# Patient Record
Sex: Female | Born: 1955
Health system: Southern US, Community
[De-identification: ages and names within clinical notes are randomized; demographics above are authoritative.]

## PROBLEM LIST (undated history)

## (undated) DIAGNOSIS — E785 Hyperlipidemia, unspecified: Secondary | ICD-10-CM

## (undated) DIAGNOSIS — I10 Essential (primary) hypertension: Secondary | ICD-10-CM

---

## 2000-05-10 ENCOUNTER — Other Ambulatory Visit: Admission: RE | Admit: 2000-05-10 | Discharge: 2000-05-10 | Payer: Self-pay | Admitting: Family Medicine

## 2002-03-05 ENCOUNTER — Other Ambulatory Visit: Admission: RE | Admit: 2002-03-05 | Discharge: 2002-03-05 | Payer: Self-pay | Admitting: Family Medicine

## 2004-06-17 ENCOUNTER — Other Ambulatory Visit: Admission: RE | Admit: 2004-06-17 | Discharge: 2004-06-17 | Payer: Self-pay | Admitting: Family Medicine

## 2007-05-01 ENCOUNTER — Encounter: Admission: RE | Admit: 2007-05-01 | Discharge: 2007-05-01 | Payer: Self-pay | Admitting: Family Medicine

## 2012-02-12 ENCOUNTER — Emergency Department (HOSPITAL_COMMUNITY): Payer: Self-pay

## 2012-02-12 ENCOUNTER — Encounter (HOSPITAL_COMMUNITY): Payer: Self-pay | Admitting: Emergency Medicine

## 2012-02-12 ENCOUNTER — Emergency Department (HOSPITAL_COMMUNITY)
Admission: EM | Admit: 2012-02-12 | Discharge: 2012-02-12 | Disposition: A | Discharge status: Home / Self Care | Payer: Self-pay | Attending: Emergency Medicine | Admitting: Emergency Medicine

## 2012-02-12 DIAGNOSIS — Z7982 Long term (current) use of aspirin: Secondary | ICD-10-CM | POA: Insufficient documentation

## 2012-02-12 DIAGNOSIS — E785 Hyperlipidemia, unspecified: Secondary | ICD-10-CM | POA: Insufficient documentation

## 2012-02-12 DIAGNOSIS — I10 Essential (primary) hypertension: Secondary | ICD-10-CM | POA: Insufficient documentation

## 2012-02-12 DIAGNOSIS — Z87891 Personal history of nicotine dependence: Secondary | ICD-10-CM | POA: Insufficient documentation

## 2012-02-12 DIAGNOSIS — N201 Calculus of ureter: Secondary | ICD-10-CM | POA: Insufficient documentation

## 2012-02-12 HISTORY — DX: Essential (primary) hypertension: I10

## 2012-02-12 HISTORY — DX: Hyperlipidemia, unspecified: E78.5

## 2012-02-12 LAB — URINALYSIS, ROUTINE W REFLEX MICROSCOPIC
Bilirubin Urine: NEGATIVE
Ketones, ur: NEGATIVE mg/dL
Nitrite: NEGATIVE
Urobilinogen, UA: 0.2 mg/dL (ref 0.0–1.0)

## 2012-02-12 LAB — URINE MICROSCOPIC-ADD ON

## 2012-02-12 MED ORDER — TAMSULOSIN HCL 0.4 MG PO CAPS: ORAL_CAPSULE | ORAL | Status: DC

## 2012-02-12 MED ORDER — FENTANYL CITRATE 0.05 MG/ML IJ SOLN
INTRAMUSCULAR | Status: AC
  Administered 2012-02-12: 50 ug via INTRAVENOUS
  Filled 2012-02-12: qty 2

## 2012-02-12 MED ORDER — SODIUM CHLORIDE 0.9 % IV SOLN
Freq: Once | INTRAVENOUS | Status: AC
  Administered 2012-02-12: 10 mL/h via INTRAVENOUS

## 2012-02-12 MED ORDER — ONDANSETRON HCL 4 MG/2ML IJ SOLN
4.0000 mg | Freq: Once | INTRAMUSCULAR | Status: AC
  Administered 2012-02-12: 4 mg via INTRAVENOUS
  Filled 2012-02-12: qty 2

## 2012-02-12 MED ORDER — FENTANYL CITRATE 0.05 MG/ML IJ SOLN
50.0000 ug | Freq: Once | INTRAMUSCULAR | Status: AC
  Administered 2012-02-12: 50 ug via INTRAVENOUS

## 2012-02-12 MED ORDER — OXYCODONE-ACETAMINOPHEN 5-325 MG PO TABS: ORAL_TABLET | ORAL | Status: DC

## 2012-02-12 MED ORDER — MORPHINE SULFATE 4 MG/ML IJ SOLN
4.0000 mg | Freq: Once | INTRAMUSCULAR | Status: AC
  Administered 2012-02-12: 4 mg via INTRAVENOUS
  Filled 2012-02-12: qty 1

## 2012-02-12 MED ORDER — PROMETHAZINE HCL 25 MG RE SUPP: 25.0000 mg | Freq: Four times a day (QID) | RECTAL | Status: DC | PRN

## 2012-02-12 MED ORDER — PERCOCET 5-325 MG PO TABS: ORAL_TABLET | ORAL | Status: DC

## 2012-02-12 NOTE — ED Notes (Signed)
Pt continues to c/o pain in left flank area.  Denies nausea at present time. IV infusing with no difficulty.  Medicated for pain. No distress noted. Pt denies additional needs at present.

## 2012-02-12 NOTE — ED Notes (Signed)
Patient with c/o left flank pain since Monday. +Frequency, +pain on urination. Has been incontinent at times. Denies back problems or injury.

## 2012-02-12 NOTE — ED Provider Notes (Signed)
History     CSN: [id number]  Arrival date & time 02/12/12  1627   First MD Initiated Contact with Patient 02/12/12 1811      Chief Complaint  Patient presents with  . Flank Pain    (Consider location/radiation/quality/duration/timing/severity/associated sxs/prior treatment) HPI Patient reports she had cloudy urine a few weeks ago when she drank a lot of cranberry juice and it seemed to get better. She reports 6 days ago she started having frequency. 4 days ago she noted that she was having difficulty urinating and felt a very strong urge to urinate but she couldn't. When she felt this way she started having severe pain in her left flank that radiated into her left hip. She states sometimes she is only able to dribble a small amount of urine. She states when the pain in her flank gets intense she finds it very hard to sit still. She denies any abdominal or suprapubic pain. She denies her urine being colored or dark. She denies nausea or vomiting or known fever. She was seen by her physician 2 days ago and was told she had blood and protein in her urine and was started on Cipro for a urinary tract infection. She reports the first night she took it she had vomiting. She was able to take it however last night and tonight. She's never had this before. She also denies prior history of frequent UTIs.  Her sister reports 2 sisters have had renal stones as well as their mother.  PCP [facility]   Past Medical History  Diagnosis Date  . Hypertension   . Hyperlipidemia     History reviewed. No pertinent past surgical history.  No family history on file.  History  Substance Use Topics  . Smoking status: Former Games developer  . Smokeless tobacco: Not on file  . Alcohol Use: No  Lives at home Lives with spouse  OB History    Grav Para Term Preterm Abortions TAB SAB Ect Mult Living                  Review of Systems  All other systems reviewed and are  negative.    Allergies  Crestor  Home Medications   Current Outpatient Rx  Name  Route  Sig  Dispense  Refill  . AMLODIPINE BESYLATE 5 MG PO TABS   Oral   Take 5 mg by mouth daily.         . ASPIRIN EC 81 MG PO TBEC   Oral   Take 81 mg by mouth daily.         . ATENOLOL 50 MG PO TABS   Oral   Take 50 mg by mouth daily.         Marland Kitchen CIPROFLOXACIN HCL 250 MG PO TABS   Oral   Take 250 mg by mouth 2 (two) times daily.         Marland Kitchen GARLIC 1000 MG PO CAPS   Oral   Take 1,000 mg by mouth daily.         Marland Kitchen HYDROCHLOROTHIAZIDE 25 MG PO TABS   Oral   Take 25 mg by mouth daily.         . MULTI-VITAMIN/MINERALS PO TABS   Oral   Take 1 tablet by mouth daily.         Marland Kitchen FISH OIL 1200 MG PO CAPS   Oral   Take 1,200 mg by mouth daily.         Marland Kitchen  OMEPRAZOLE MAGNESIUM 20 MG PO TBEC   Oral   Take 20 mg by mouth daily.         Marland Kitchen PRAVASTATIN SODIUM 20 MG PO TABS   Oral   Take 20 mg by mouth daily.           BP 113/68  Pulse 68  Temp 98.3 F (36.8 C) (Oral)  Resp 16  Ht 5\' 3"  (1.6 m)  Wt 239 lb (108.41 kg)  BMI 42.34 kg/m2  SpO2 100%  Vital signs normal    Physical Exam  Nursing note and vitals reviewed. Constitutional: She is oriented to person, place, and time. She appears well-developed and well-nourished.  Non-toxic appearance. She does not appear ill. No distress.  HENT:  Head: Normocephalic and atraumatic.  Right Ear: External ear normal.  Left Ear: External ear normal.  Nose: Nose normal. No mucosal edema or rhinorrhea.  Mouth/Throat: Oropharynx is clear and moist and mucous membranes are normal. No dental abscesses or uvula swelling.  Eyes: Conjunctivae normal and EOM are normal. Pupils are equal, round, and reactive to light.  Neck: Normal range of motion and full passive range of motion without pain. Neck supple.  Cardiovascular: Normal rate, regular rhythm and normal heart sounds.  Exam reveals no gallop and no friction rub.   No murmur  heard. Pulmonary/Chest: Effort normal and breath sounds normal. No respiratory distress. She has no wheezes. She has no rhonchi. She has no rales. She exhibits no tenderness and no crepitus.  Abdominal: Soft. Normal appearance and bowel sounds are normal. She exhibits no distension. There is no tenderness. There is no rebound and no guarding.  Genitourinary:       + left CVAT to percussion  Musculoskeletal: Normal range of motion. She exhibits no edema and no tenderness.       Moves all extremities well.   Neurological: She is alert and oriented to person, place, and time. She has normal strength. No cranial nerve deficit.  Skin: Skin is warm, dry and intact. No rash noted. No erythema. There is pallor.  Psychiatric: She has a normal mood and affect. Her speech is normal and behavior is normal. Her mood appears not anxious.    ED Course  Procedures (including critical care time)   Medications  fentaNYL (SUBLIMAZE) injection 50 mcg (50 mcg Intravenous Given 02/12/12 1723)  0.9 %  sodium chloride infusion (10 mL/hr Intravenous New Bag/Given 02/12/12 1722)  morphine 4 MG/ML injection 4 mg (4 mg Intravenous Given 02/12/12 1917)  ondansetron (ZOFRAN) injection 4 mg (4 mg Intravenous Given 02/12/12 1917)   Pain improved to a "2", given morphine just prior to discharge.    Results for orders placed during the hospital encounter of 02/12/12  URINALYSIS, ROUTINE W REFLEX MICROSCOPIC      Component Value Range   Color, Urine YELLOW  YELLOW   APPearance CLEAR  CLEAR   Specific Gravity, Urine 1.010  1.005 - 1.030   pH 6.0  5.0 - 8.0   Glucose, UA NEGATIVE  NEGATIVE mg/dL   Hgb urine dipstick LARGE (*) NEGATIVE   Bilirubin Urine NEGATIVE  NEGATIVE   Ketones, ur NEGATIVE  NEGATIVE mg/dL   Protein, ur NEGATIVE  NEGATIVE mg/dL   Urobilinogen, UA 0.2  0.0 - 1.0 mg/dL   Nitrite NEGATIVE  NEGATIVE   Leukocytes, UA NEGATIVE  NEGATIVE  URINE MICROSCOPIC-ADD ON      Component Value Range    Squamous Epithelial / LPF FEW (*) RARE  WBC, UA 3-6  <3 WBC/hpf   RBC / HPF 3-6  <3 RBC/hpf   Laboratory interpretation all normal except hematuria    Ct Abdomen Pelvis Wo Contrast  02/12/2012  *RADIOLOGY REPORT*  Clinical Data: Flank pain.  Evaluate for kidney stone  CT ABDOMEN AND PELVIS WITHOUT CONTRAST  Technique:  Multidetector CT imaging of the abdomen and pelvis was performed following the standard protocol without intravenous contrast.  Comparison: None  Findings: Lung bases:   Lung bases are clear.  No pericardial or pleural effusion.  The heart size is normal.  Abdomen/pelvis:  Mild fatty infiltration of the liver.  There is no focal liver abnormality.  The gallbladder is normal.  No biliary dilatation. The pancreas is within normal limits.  A normal appearance of the spleen.  Both of the adrenal glands appear normal.  The right kidney is within normal limits.  There is asymmetric left- sided nephromegaly and mild hydronephrosis.  Mild hydroureter is also noted.  Within the distal left ureter there is a stone which measures 4.8 mm, image 74.  The uterus appears normal.  There is a cyst within the left ovary which measures 2.5 x 2.9 cm, image 67.  No enlarged upper abdominal lymph nodes there is no pelvic or inguinal adenopathy.  There is no free fluid or fluid collections within the abdomen or pelvis.  The stomach appears normal.  The small bowel loops are within normal limits.  The appendix is visualized and appears normal.  Normal appearance of the proximal colon.  There is multiple sigmoid diverticula without acute inflammation.  The Bones/Musculoskeletal:  Review of the visualized osseous structures shows asymmetric areas of increased density and widening of the cortex involving the left iliac bone and proximal left femur.  IMPRESSION:  1.  Distal left ureteral calculus.  There is associated left-sided hydronephrosis and hydroureter. 2.  Left ovarian cyst. 3.  Asymmetric areas of increased  density and widening of the cortex involving the proximal left femur and left iliac bone. Favored to melorheostosis.   Original Report Authenticated By: Signa Kell, M.D.      1. Ureteral stone    New Prescriptions   OXYCODONE-ACETAMINOPHEN (PERCOCET/ROXICET) 5-325 MG PER TABLET    Take 1 or 2 po Q 6hrs for pain   PERCOCET 5-325 MG PER TABLET    Take 1 or 2 po Q 6hrs for pain   PROMETHAZINE (PHENERGAN) 25 MG SUPPOSITORY    Place 1 suppository (25 mg total) rectally every 6 (six) hours as needed for nausea.   TAMSULOSIN HCL (FLOMAX) 0.4 MG CAPS    Take 1 po QD until you pass the stone.    Plan discharge  Devoria Albe, MD, FACEP    MDM          Ward Givens, MD 02/13/12 [phone]

## 2012-02-13 MED ORDER — OXYCODONE-ACETAMINOPHEN 5-325 MG PO TABS: 2.0000 | ORAL_TABLET | ORAL | Status: DC | PRN

## 2012-02-14 MED FILL — Oxycodone w/ Acetaminophen Tab 5-325 MG: ORAL | Qty: 6 | Status: AC

## 2013-04-16 ENCOUNTER — Encounter: Payer: Self-pay | Admitting: General Practice

## 2013-04-16 ENCOUNTER — Ambulatory Visit (INDEPENDENT_AMBULATORY_CARE_PROVIDER_SITE_OTHER): Payer: BC Managed Care – PPO | Admitting: General Practice

## 2013-04-16 ENCOUNTER — Encounter (INDEPENDENT_AMBULATORY_CARE_PROVIDER_SITE_OTHER): Payer: Self-pay

## 2013-04-16 VITALS — BP 126/77 | HR 52 | Temp 97.7°F | Ht 63.0 in | Wt 215.0 lb

## 2013-04-16 DIAGNOSIS — Z7689 Persons encountering health services in other specified circumstances: Secondary | ICD-10-CM

## 2013-04-16 DIAGNOSIS — E785 Hyperlipidemia, unspecified: Secondary | ICD-10-CM | POA: Insufficient documentation

## 2013-04-16 DIAGNOSIS — I10 Essential (primary) hypertension: Secondary | ICD-10-CM

## 2013-04-16 DIAGNOSIS — K219 Gastro-esophageal reflux disease without esophagitis: Secondary | ICD-10-CM

## 2013-04-16 NOTE — Patient Instructions (Signed)

## 2013-04-16 NOTE — Progress Notes (Signed)
   Subjective:    Patient ID: Carla Simmons, female    DOB: 13-Jan-1956, 58 y.o.   MRN: [id number]  HPI Patient presents today to establish care. She has a history of HTN, HLD and GERD. She was being seen at [facility] now has insurance. Reports taking medications as prescribed, denies taking statin, due to inability to tolerated. Denies regular exercise or healthy eating.     Review of Systems  Constitutional: Negative for fever and chills.  Respiratory: Negative for chest tightness and shortness of breath.   Cardiovascular: Negative for chest pain and palpitations.  Gastrointestinal: Negative for nausea, vomiting, abdominal pain, diarrhea, constipation and blood in stool.  Genitourinary: Negative for dysuria, urgency, hematuria and difficulty urinating.  Neurological: Negative for dizziness, weakness and headaches.  All other systems reviewed and are negative.       Objective:   Physical Exam  Constitutional: She is oriented to person, place, and time. She appears well-developed and well-nourished.  HENT:  Head: Normocephalic and atraumatic.  Right Ear: External ear normal.  Left Ear: External ear normal.  Nose: Nose normal.  Mouth/Throat: Oropharynx is clear and moist.  Eyes: Pupils are equal, round, and reactive to light.  Neck: Normal range of motion. Neck supple. No thyromegaly present.  Cardiovascular: Normal rate, regular rhythm and normal heart sounds.   Pulmonary/Chest: Effort normal and breath sounds normal. No respiratory distress. She exhibits no tenderness.  Abdominal: Soft. Bowel sounds are normal. She exhibits no distension. There is no tenderness.  Lymphadenopathy:    She has no cervical adenopathy.  Neurological: She is alert and oriented to person, place, and time.  Skin: Skin is warm and dry.  Psychiatric: She has a normal mood and affect.          Assessment & Plan:  1. HTN (hypertension)  - POCT CBC; Future - CMP14+EGFR;  Future  2. Hyperlipidemia  - NMR, lipoprofile; Future  3. Establishing care with new doctor, encounter for   4. GERD (gastroesophageal reflux disease) -Continue all current medications Labs pending F/u in 3 months Discussed benefits of regular exercise and healthy eating Patient verbalized understanding Erby Pian, FNP-C

## 2013-04-17 ENCOUNTER — Other Ambulatory Visit: Payer: BC Managed Care – PPO

## 2013-04-18 ENCOUNTER — Other Ambulatory Visit (INDEPENDENT_AMBULATORY_CARE_PROVIDER_SITE_OTHER): Payer: BC Managed Care – PPO

## 2013-04-18 DIAGNOSIS — E785 Hyperlipidemia, unspecified: Secondary | ICD-10-CM

## 2013-04-18 DIAGNOSIS — I10 Essential (primary) hypertension: Secondary | ICD-10-CM

## 2013-04-18 LAB — POCT CBC
GRANULOCYTE PERCENT: 60.3 % (ref 37–80)
HEMATOCRIT: 42.1 % (ref 37.7–47.9)
Hemoglobin: 13.2 g/dL (ref 12.2–16.2)
LYMPH, POC: 4.8 — AB (ref 0.6–3.4)
MCH: 28.2 pg (ref 27–31.2)
MCHC: 31.4 g/dL — AB (ref 31.8–35.4)
MCV: 90 fL (ref 80–97)
MPV: 7.8 fL (ref 0–99.8)
POC Granulocyte: 8.1 — AB (ref 2–6.9)
POC LYMPH PERCENT: 35.5 %L (ref 10–50)
Platelet Count, POC: 339 10*3/uL (ref 142–424)
RBC: 4.7 M/uL (ref 4.04–5.48)
RDW, POC: 13 %
WBC: 13.4 10*3/uL — AB (ref 4.6–10.2)

## 2013-04-20 LAB — NMR, LIPOPROFILE
CHOLESTEROL: 210 mg/dL — AB (ref <200)
HDL Cholesterol by NMR: 52 mg/dL (ref >=40)
HDL PARTICLE NUMBER: 34.2 umol/L (ref >=30.5)
LDL Particle Number: 1881 nmol/L — ABNORMAL HIGH (ref <1000)
LDL SIZE: 21.1 nm (ref >20.5)
LDLC SERPL CALC-MCNC: 133 mg/dL — ABNORMAL HIGH (ref <100)
LP-IR SCORE: 75 — AB (ref <=45)
SMALL LDL PARTICLE NUMBER: 575 nmol/L — AB (ref <=527)
Triglycerides by NMR: 127 mg/dL (ref <150)

## 2013-04-20 LAB — CMP14+EGFR
ALBUMIN: 4.5 g/dL (ref 3.5–5.5)
ALK PHOS: 85 IU/L (ref 39–117)
ALT: 20 IU/L (ref 0–32)
AST: 21 IU/L (ref 0–40)
Albumin/Globulin Ratio: 1.9 (ref 1.1–2.5)
BILIRUBIN TOTAL: 0.3 mg/dL (ref 0.0–1.2)
BUN / CREAT RATIO: 34 — AB (ref 9–23)
BUN: 27 mg/dL — AB (ref 6–24)
CO2: 27 mmol/L (ref 18–29)
CREATININE: 0.8 mg/dL (ref 0.57–1.00)
Calcium: 9.7 mg/dL (ref 8.7–10.2)
Chloride: 99 mmol/L (ref 97–108)
GFR calc non Af Amer: 82 mL/min/{1.73_m2} (ref >59)
GFR, EST AFRICAN AMERICAN: 94 mL/min/{1.73_m2} (ref >59)
GLOBULIN, TOTAL: 2.4 g/dL (ref 1.5–4.5)
Glucose: 91 mg/dL (ref 65–99)
Potassium: 4.3 mmol/L (ref 3.5–5.2)
SODIUM: 142 mmol/L (ref 134–144)
Total Protein: 6.9 g/dL (ref 6.0–8.5)

## 2013-04-24 ENCOUNTER — Other Ambulatory Visit: Payer: Self-pay | Admitting: *Deleted

## 2013-04-24 MED ORDER — HYDROCHLOROTHIAZIDE 25 MG PO TABS: 25.0000 mg | ORAL_TABLET | Freq: Every day | ORAL | Status: DC

## 2013-04-24 MED ORDER — ATENOLOL 50 MG PO TABS: 50.0000 mg | ORAL_TABLET | Freq: Every day | ORAL | Status: DC

## 2013-05-01 ENCOUNTER — Other Ambulatory Visit: Payer: Self-pay | Admitting: General Practice

## 2013-05-01 DIAGNOSIS — D72829 Elevated white blood cell count, unspecified: Secondary | ICD-10-CM

## 2013-05-03 ENCOUNTER — Other Ambulatory Visit (INDEPENDENT_AMBULATORY_CARE_PROVIDER_SITE_OTHER): Payer: BC Managed Care – PPO

## 2013-05-03 DIAGNOSIS — D72829 Elevated white blood cell count, unspecified: Secondary | ICD-10-CM

## 2013-05-03 LAB — POCT CBC
Granulocyte percent: 57.8 %G (ref 37–80)
HEMATOCRIT: 38.8 % (ref 37.7–47.9)
HEMOGLOBIN: 12.7 g/dL (ref 12.2–16.2)
Lymph, poc: 4.2 — AB (ref 0.6–3.4)
MCH: 29.4 pg (ref 27–31.2)
MCHC: 32.7 g/dL (ref 31.8–35.4)
MCV: 89.8 fL (ref 80–97)
MPV: 7.7 fL (ref 0–99.8)
POC Granulocyte: 6.2 (ref 2–6.9)
POC LYMPH PERCENT: 38.5 %L (ref 10–50)
Platelet Count, POC: 306 10*3/uL (ref 142–424)
RBC: 4.3 M/uL (ref 4.04–5.48)
RDW, POC: 13.4 %
WBC: 10.8 10*3/uL — AB (ref 4.6–10.2)

## 2013-06-05 ENCOUNTER — Encounter: Payer: Self-pay | Admitting: *Deleted

## 2013-06-07 ENCOUNTER — Encounter: Payer: Self-pay | Admitting: General Practice

## 2013-06-07 ENCOUNTER — Ambulatory Visit (INDEPENDENT_AMBULATORY_CARE_PROVIDER_SITE_OTHER): Payer: BC Managed Care – PPO | Admitting: General Practice

## 2013-06-07 VITALS — BP 163/79 | HR 56 | Temp 97.2°F | Ht 63.0 in | Wt 214.0 lb

## 2013-06-07 DIAGNOSIS — R002 Palpitations: Secondary | ICD-10-CM

## 2013-06-07 DIAGNOSIS — Z8249 Family history of ischemic heart disease and other diseases of the circulatory system: Secondary | ICD-10-CM

## 2013-06-07 DIAGNOSIS — R079 Chest pain, unspecified: Secondary | ICD-10-CM

## 2013-06-07 NOTE — Patient Instructions (Signed)
Palpitations   A palpitation is the feeling that your heartbeat is irregular or is faster than normal. It may feel like your heart is fluttering or skipping a beat. Palpitations are usually not a serious problem. However, in some cases, you may need further medical evaluation.  CAUSES   Palpitations can be caused by:   Smoking.   Caffeine or other stimulants, such as diet pills or energy drinks.   Alcohol.   Stress and anxiety.   Strenuous physical activity.   Fatigue.   Certain medicines.   Heart disease, especially if you have a history of arrhythmias. This includes atrial fibrillation, atrial flutter, or supraventricular tachycardia.   An improperly working pacemaker or defibrillator.  DIAGNOSIS   To find the cause of your palpitations, your caregiver will take your history and perform a physical exam. Tests may also be done, including:   Electrocardiography (ECG). This test records the heart's electrical activity.   Cardiac monitoring. This allows your caregiver to monitor your heart rate and rhythm in real time.   Holter monitor. This is a portable device that records your heartbeat and can help diagnose heart arrhythmias. It allows your caregiver to track your heart activity for several days, if needed.   Stress tests by exercise or by giving medicine that makes the heart beat faster.  TREATMENT   Treatment of palpitations depends on the cause of your symptoms and can vary greatly. Most cases of palpitations do not require any treatment other than time, relaxation, and monitoring your symptoms. Other causes, such as atrial fibrillation, atrial flutter, or supraventricular tachycardia, usually require further treatment.  HOME CARE INSTRUCTIONS    Avoid:   Caffeinated coffee, tea, soft drinks, diet pills, and energy drinks.   Chocolate.   Alcohol.   Stop smoking if you smoke.   Reduce your stress and anxiety. Things that can help you relax include:   A method that measures bodily functions so  you can learn to control them (biofeedback).   Yoga.   Meditation.   Physical activity such as swimming, jogging, or walking.   Get plenty of rest and sleep.  SEEK MEDICAL CARE IF:    You continue to have a fast or irregular heartbeat beyond 24 hours.   Your palpitations occur more often.  SEEK IMMEDIATE MEDICAL CARE IF:   You develop chest pain or shortness of breath.   You have a severe headache.   You feel dizzy, or you faint.  MAKE SURE YOU:   Understand these instructions.   Will watch your condition.   Will get help right away if you are not doing well or get worse.  Document Released: 03/18/2000 Document Revised: 07/16/2012 Document Reviewed: 05/20/2011  ExitCare Patient Information 2014 [employer].

## 2013-06-07 NOTE — Progress Notes (Signed)
   Subjective:    Patient ID: Carla Simmons, female    DOB: Jun 08, 1955, 58 y.o.   MRN: [id number]  HPI Patient presents today with complaints of heart palpitations. Onset two weeks ago, with total of 3 episodes, which awakes her from sleep. History of palpitations during her 30's and taking atenolol since then. Denies checking blood pressure at home. Consuming 2-3 cups of caffeine daily and increased stress over past 3 months.     Review of Systems  Constitutional: Negative for fever and chills.  Respiratory: Negative for chest tightness and shortness of breath.   Cardiovascular: Positive for palpitations. Negative for chest pain and leg swelling.  Neurological: Negative for dizziness, weakness and headaches.       Objective:   Physical Exam  Constitutional: She is oriented to person, place, and time. She appears well-developed and well-nourished.  Cardiovascular: Normal rate, regular rhythm and normal heart sounds.   Pulmonary/Chest: Effort normal and breath sounds normal. No respiratory distress. She exhibits no tenderness.  Abdominal: Soft. Bowel sounds are normal. She exhibits no distension. There is no tenderness.  Neurological: She is alert and oriented to person, place, and time.  Skin: Skin is warm and dry.  Psychiatric: She has a normal mood and affect.          Assessment & Plan:  1. Chest pain  - EKG 12-Lead (sinus brady)  2. Palpitations  - CMP14+EGFR - Thyroid Panel With TSH - Ambulatory referral to Cardiology - Holter monitor - 24 hour; Future   3. Family history of heart disease  - Ambulatory referral to Cardiology -may seek emergency treatment  -RTO prn -Patient verbalized understanding Erby Pian, FNP-C

## 2013-06-08 LAB — THYROID PANEL WITH TSH
FREE THYROXINE INDEX: 2 (ref 1.2–4.9)
T3 Uptake Ratio: 25 % (ref 24–39)
T4, Total: 7.9 ug/dL (ref 4.5–12.0)
TSH: 1.74 u[IU]/mL (ref 0.450–4.500)

## 2013-06-08 LAB — CMP14+EGFR
A/G RATIO: 1.8 (ref 1.1–2.5)
ALT: 18 IU/L (ref 0–32)
AST: 19 IU/L (ref 0–40)
Albumin: 4.5 g/dL (ref 3.5–5.5)
Alkaline Phosphatase: 95 IU/L (ref 39–117)
BILIRUBIN TOTAL: 0.5 mg/dL (ref 0.0–1.2)
BUN / CREAT RATIO: 20 (ref 9–23)
BUN: 13 mg/dL (ref 6–24)
CO2: 29 mmol/L (ref 18–29)
Calcium: 9.7 mg/dL (ref 8.7–10.2)
Chloride: 100 mmol/L (ref 97–108)
Creatinine, Ser: 0.64 mg/dL (ref 0.57–1.00)
GFR, EST AFRICAN AMERICAN: 114 mL/min/{1.73_m2} (ref >59)
GFR, EST NON AFRICAN AMERICAN: 99 mL/min/{1.73_m2} (ref >59)
GLUCOSE: 100 mg/dL — AB (ref 65–99)
Globulin, Total: 2.5 g/dL (ref 1.5–4.5)
POTASSIUM: 4.4 mmol/L (ref 3.5–5.2)
SODIUM: 143 mmol/L (ref 134–144)
TOTAL PROTEIN: 7 g/dL (ref 6.0–8.5)

## 2013-06-10 ENCOUNTER — Ambulatory Visit (INDEPENDENT_AMBULATORY_CARE_PROVIDER_SITE_OTHER): Payer: BC Managed Care – PPO | Admitting: *Deleted

## 2013-06-10 DIAGNOSIS — R002 Palpitations: Secondary | ICD-10-CM

## 2013-06-10 NOTE — Progress Notes (Signed)
Patient ID: Carla Simmons, female   DOB: 03/11/1956, 58 y.o.   MRN: [id number] Holter monitor placed on pt for 24 hours for palpitations [facility]. Pt. Will return monitor to clinic tom.

## 2013-06-14 NOTE — Progress Notes (Signed)
[facility] into epic

## 2013-06-28 ENCOUNTER — Encounter: Payer: Self-pay | Admitting: Cardiology

## 2013-06-28 ENCOUNTER — Ambulatory Visit (INDEPENDENT_AMBULATORY_CARE_PROVIDER_SITE_OTHER): Payer: BC Managed Care – PPO | Admitting: Cardiology

## 2013-06-28 VITALS — BP 136/54 | HR 69 | Ht 63.0 in | Wt 220.0 lb

## 2013-06-28 DIAGNOSIS — I4949 Other premature depolarization: Secondary | ICD-10-CM

## 2013-06-28 DIAGNOSIS — R002 Palpitations: Secondary | ICD-10-CM | POA: Insufficient documentation

## 2013-06-28 DIAGNOSIS — I10 Essential (primary) hypertension: Secondary | ICD-10-CM

## 2013-06-28 DIAGNOSIS — I493 Ventricular premature depolarization: Secondary | ICD-10-CM

## 2013-06-28 NOTE — Assessment & Plan Note (Signed)
She continues on Atenolol, Norvasc, and HCTZ. Keep followup with primary care.

## 2013-06-28 NOTE — Assessment & Plan Note (Signed)
Occasional, noted by recent Holter monitor, not entirely clear that this is causing her any symptoms. Would still agree with reducing caffeine, particularly before sleep hours. Would continue beta blocker and observation. We will also obtain an echocardiogram to at least exclude any underlying cardiac structural abnormalities that might warrant additional evaluation. If reassuring, she can keep follow up with primary care.

## 2013-06-28 NOTE — Assessment & Plan Note (Signed)
The sensation of "fluttering" seems to be better after going to decaffeinated coffee.

## 2013-06-28 NOTE — Progress Notes (Signed)
Clinical Summary Carla Simmons is a 58 y.o.female referred for cardiology consultation by Dr. Modesto Charon. She reports a history of "fluttering" sensation in her chest, sometimes when she wakes up from sleep. She works the night shift, and sleeps during the daytime hours after she gets home. She states that she usually drinks a cup of coffee before she goes to sleep, has recently changed to decaf and states that her symptoms have improved. She has never had any sudden onset dizziness or syncope, but generally feel palpitations otherwise.  ECG shows a sinus bradycardia. Recent Holter monitor reviewed finding sinus rhythm ranging 52-86 beats per minutes, no significant pauses, occasional PVCs some of which are intercalated (0.3%), and rare PACs, no sustained arrhythmias. Lab work from this March shows BUN 13, creatinine 0.6, potassium 4.4, TSH 1.7. Lipid panel in January reviewed, HDL 52, triglycerides 127, total cholesterol 210, LDL 133 with high particle number.  I reviewed her recent cardiac monitor. She does have PVCs, although not entirely clear that these are causing her symptoms. I agree with cutting back caffeine. She has been on beta blocker long-term. Recalls having an echocardiogram perhaps 30 years ago, no followup since that time.   Allergies  Allergen Reactions  . Crestor [Rosuvastatin]     Lightheadedness.    Current Outpatient Prescriptions  Medication Sig Dispense Refill  . amLODipine (NORVASC) 5 MG tablet Take 5 mg by mouth daily.      Marland Kitchen aspirin EC 81 MG tablet Take 81 mg by mouth daily.      Marland Kitchen atenolol (TENORMIN) 50 MG tablet Take 1 tablet (50 mg total) by mouth daily.  30 tablet  2  . Ferrous Sulfate (IRON) 325 (65 FE) MG TABS Take by mouth.      . Garlic 1000 MG CAPS Take 1,000 mg by mouth daily.      . hydrochlorothiazide (HYDRODIURIL) 25 MG tablet Take 1 tablet (25 mg total) by mouth daily.  30 tablet  5  . Magnesium 250 MG TABS Take by mouth.      . Multiple Vitamins-Minerals  (MULTIVITAMIN WITH MINERALS) tablet Take 1 tablet by mouth daily.      . Omega-3 Fatty Acids (FISH OIL) 1200 MG CAPS Take 1,200 mg by mouth daily.      Marland Kitchen omeprazole (PRILOSEC OTC) 20 MG tablet Take 20 mg by mouth daily.       No current facility-administered medications for this visit.    Past Medical History  Diagnosis Date  . Hypertension   . Hyperlipidemia     History reviewed. No pertinent past surgical history.  Family History  Problem Relation Age of Onset  . Diabetes Mother   . Heart disease Father     Social History Carla Simmons reports that she has quit smoking. Her smoking use included Cigarettes. She smoked 0.00 packs per day. She does not have any smokeless tobacco history on file. Carla Simmons reports that she does not drink alcohol.  Review of Systems No exertional chest pain, NYHA class II dyspnea. No orthopnea or PND. No claudication. Otherwise negative.  Physical Examination Filed Vitals:   06/28/13 0817  BP: 136/54  Pulse: 69   Filed Weights   06/28/13 0817  Weight: 220 lb (99.791 kg)   Overweight woman, appears comfortable at rest. HEENT: Conjunctiva and lids normal, oropharynx clear. Neck: Supple, no elevated JVP or carotid bruits, no thyromegaly. Lungs: Clear to auscultation, nonlabored breathing at rest. Cardiac: Regular rate and rhythm, no S3 or significant  systolic murmur, no pericardial rub. Abdomen: Soft, nontender, bowel sounds present, no guarding or rebound. Extremities: No pitting edema, distal pulses 2+. Skin: Warm and dry. Musculoskeletal: No kyphosis. Neuropsychiatric: Alert and oriented x3, affect grossly appropriate.   Problem List and Plan   PVC's (premature ventricular contractions) Occasional, noted by recent Holter monitor, not entirely clear that this is causing her any symptoms. Would still agree with reducing caffeine, particularly before sleep hours. Would continue beta blocker and observation. We will also obtain an  echocardiogram to at least exclude any underlying cardiac structural abnormalities that might warrant additional evaluation. If reassuring, she can keep follow up with primary care.  Palpitations The sensation of "fluttering" seems to be better after going to decaffeinated coffee.  HTN (hypertension) She continues on Atenolol, Norvasc, and HCTZ. Keep followup with primary care.    Jonelle SidleSamuel G. Suprina Mandeville, M.D., F.A.C.C.

## 2013-06-28 NOTE — Patient Instructions (Signed)
Your physician recommends that you schedule a follow-up appointment to be determined.we will call you with echo reults     Your physician has requested that you have an echocardiogram. Echocardiography is a painless test that uses sound waves to create images of your heart. It provides your doctor with information about the size and shape of your heart and how well your heart's chambers and valves are working. This procedure takes approximately one hour. There are no restrictions for this procedure.    Your physician recommends that you continue on your current medications as directed. Please refer to the Current Medication list given to you today.    Thank you for choosing [facility] !

## 2013-07-04 ENCOUNTER — Ambulatory Visit (HOSPITAL_COMMUNITY): Payer: BC Managed Care – PPO | Attending: Cardiology

## 2013-07-08 ENCOUNTER — Other Ambulatory Visit: Payer: Self-pay | Admitting: *Deleted

## 2013-07-08 MED ORDER — ATENOLOL 50 MG PO TABS: 50.0000 mg | ORAL_TABLET | Freq: Every day | ORAL | Status: DC

## 2013-07-22 ENCOUNTER — Ambulatory Visit: Payer: BC Managed Care – PPO | Admitting: General Practice

## 2013-07-23 ENCOUNTER — Encounter: Payer: Self-pay | Admitting: General Practice

## 2013-07-23 ENCOUNTER — Ambulatory Visit (INDEPENDENT_AMBULATORY_CARE_PROVIDER_SITE_OTHER): Payer: BC Managed Care – PPO | Admitting: General Practice

## 2013-07-23 VITALS — BP 146/82 | HR 62 | Temp 98.4°F | Ht 63.6 in | Wt 218.2 lb

## 2013-07-23 DIAGNOSIS — K219 Gastro-esophageal reflux disease without esophagitis: Secondary | ICD-10-CM

## 2013-07-23 DIAGNOSIS — E785 Hyperlipidemia, unspecified: Secondary | ICD-10-CM

## 2013-07-23 DIAGNOSIS — R002 Palpitations: Secondary | ICD-10-CM

## 2013-07-23 DIAGNOSIS — I10 Essential (primary) hypertension: Secondary | ICD-10-CM

## 2013-07-23 NOTE — Patient Instructions (Signed)

## 2013-07-23 NOTE — Progress Notes (Signed)
   Subjective:    Patient ID: Carla Simmons, female    DOB: 03/30/1956, 58 y.o.   MRN: [id number]  HPI Patient presents today for follow up of heart palpitations. History of htn, hld, gerd, and palpitations. Taking medications as directed. Reports working on eating a healthy diet, but denies regular exercise. Patient denies heart palpitations since discontinuing drinks containing caffeine.     Review of Systems  Constitutional: Negative for fever and chills.  Respiratory: Negative for chest tightness and shortness of breath.   Cardiovascular: Negative for chest pain and palpitations.  Gastrointestinal: Negative for nausea, vomiting, abdominal pain, diarrhea, constipation and blood in stool.  Genitourinary: Negative for dysuria, urgency, hematuria and difficulty urinating.  Neurological: Negative for dizziness, weakness and headaches.  All other systems reviewed and are negative.      Objective:   Physical Exam  Constitutional: She is oriented to person, place, and time. She appears well-developed and well-nourished.  HENT:  Head: Normocephalic and atraumatic.  Right Ear: External ear normal.  Left Ear: External ear normal.  Nose: Nose normal.  Mouth/Throat: Oropharynx is clear and moist.  Eyes: Pupils are equal, round, and reactive to light.  Neck: Normal range of motion. Neck supple. No thyromegaly present.  Cardiovascular: Normal rate, regular rhythm and normal heart sounds.   Pulmonary/Chest: Effort normal and breath sounds normal. No respiratory distress. She exhibits no tenderness.  Abdominal: Soft. Bowel sounds are normal. She exhibits no distension. There is no tenderness.  Lymphadenopathy:    She has no cervical adenopathy.  Neurological: She is alert and oriented to person, place, and time.  Skin: Skin is warm and dry.  Psychiatric: She has a normal mood and affect.          Assessment & Plan:  1. Heart palpitations   2. Hyperlipidemia   3. GERD  (gastroesophageal reflux disease)   4. Hypertension -Continue all current medications Labs pending F/u in 3 months Discussed benefits of regular exercise and healthy eating Patient verbalized understanding Coralie KeensMae E. Cordarious Zeek, FNP-C

## 2013-08-05 ENCOUNTER — Encounter: Payer: Self-pay | Admitting: Family Medicine

## 2013-08-05 ENCOUNTER — Ambulatory Visit (INDEPENDENT_AMBULATORY_CARE_PROVIDER_SITE_OTHER): Payer: BC Managed Care – PPO | Admitting: Family Medicine

## 2013-08-05 VITALS — BP 133/78 | HR 73 | Temp 97.6°F | Ht 63.6 in | Wt 217.4 lb

## 2013-08-05 DIAGNOSIS — N390 Urinary tract infection, site not specified: Secondary | ICD-10-CM

## 2013-08-05 LAB — POCT URINALYSIS DIPSTICK
Bilirubin, UA: NEGATIVE
Blood, UA: NEGATIVE
Glucose, UA: NEGATIVE
Ketones, UA: NEGATIVE
Leukocytes, UA: NEGATIVE
Nitrite, UA: NEGATIVE
Protein, UA: NEGATIVE
Spec Grav, UA: >=1.03
Urobilinogen, UA: NEGATIVE
pH, UA: 5

## 2013-08-05 LAB — POCT UA - MICROSCOPIC ONLY
Casts, Ur, LPF, POC: NEGATIVE
Yeast, UA: NEGATIVE

## 2013-08-05 NOTE — Progress Notes (Signed)
   Subjective:    Patient ID: Carla Simmons, female    DOB: 10/16/1955, 58 y.o.   MRN: [id number]  HPI  This 58 y.o. female presents for evaluation [facility] and she was tx' d with abx's and she has been feeling a lot better.  She took .  Review of Systems    No chest pain, SOB, HA, dizziness, vision change, N/V, diarrhea, constipation, dysuria, urinary urgency or frequency, myalgias, arthralgias or rash.  Objective:   Physical Exam Vital signs noted  Well developed well nourished female.  HEENT - Head atraumatic Normocephalic                Eyes - PERRLA, Conjuctiva - clear Sclera- Clear EOMI                Ears - EAC's Wnl TM's Wnl Gross Hearing WNL                Nose - Nares patent                 Throat - oropharanx wnl Respiratory - Lungs CTA bilateral Cardiac - RRR S1 and S2 without murmur GI - Abdomen soft Nontender and bowel sounds active x 4 Extremities - No edema. Neuro - Grossly intact.   Results for orders placed in visit on 08/05/13  POCT UA - MICROSCOPIC ONLY      Result Value Ref Range   WBC, Ur, HPF, POC rare     RBC, urine, microscopic rare     Bacteria, U Microscopic rare     Mucus, UA occ     Epithelial cells, urine per micros occ     Crystals, Ur, HPF, POC mod     Casts, Ur, LPF, POC neg     Yeast, UA neg    POCT URINALYSIS DIPSTICK      Result Value Ref Range   Color, UA straw     Clarity, UA cloudy     Glucose, UA neg     Bilirubin, UA neg     Ketones, UA neg     Spec Grav, UA >=1.030     Blood, UA neg     pH, UA 5.0     Protein, UA neg     Urobilinogen, UA negative     Nitrite, UA neg     Leukocytes, UA Negative        Assessment & Plan:  UTI (urinary tract infection) - Plan: POCT UA - Microscopic Only, POCT urinalysis dipstick UTI resolved and recommend follow up prn any UA sx's  Deatra CanterWilliam J Oxford FNP

## 2013-10-02 ENCOUNTER — Ambulatory Visit (INDEPENDENT_AMBULATORY_CARE_PROVIDER_SITE_OTHER): Payer: BC Managed Care – PPO | Admitting: *Deleted

## 2013-10-02 DIAGNOSIS — Z23 Encounter for immunization: Secondary | ICD-10-CM

## 2013-10-02 NOTE — Patient Instructions (Signed)
Tetanus, Diphtheria, Pertussis (Tdap) Vaccine  What You Need to Know  WHY GET VACCINATED?  Tetanus, diphtheria and pertussis can be very serious diseases, even for adolescents and adults. Tdap vaccine can protect us from these diseases.  TETANUS (Lockjaw) causes painful muscle tightening and stiffness, usually all over the body.  · It can lead to tightening of muscles in the head and neck so you can't open your mouth, swallow, or sometimes even breathe. Tetanus kills about 1 out of 5 people who are infected.  DIPHTHERIA can cause a thick coating to form in the back of the throat.  · It can lead to breathing problems, paralysis, heart failure, and death.  PERTUSSIS (Whooping Cough) causes severe coughing spells, which can cause difficulty breathing, vomiting and disturbed sleep.  · It can also lead to weight loss, incontinence, and rib fractures. Up to 2 in 100 adolescents and 5 in 100 adults with pertussis are hospitalized or have complications, which could include pneumonia and death.  These diseases are caused by bacteria. Diphtheria and pertussis are spread from person to person through coughing or sneezing. Tetanus enters the body through cuts, scratches, or wounds.  Before vaccines, the [place] saw as many as 200,000 cases a year of diphtheria and pertussis, and hundreds of cases of tetanus. Since vaccination began, tetanus and diphtheria have dropped by about 99% and pertussis by about 80%.  TDAP VACCINE  Tdap vaccine can protect adolescents and adults from tetanus, diphtheria, and pertussis. One dose of Tdap is routinely given at age 11 or 12. People who did not get Tdap at that age should get it as soon as possible.  Tdap is especially important for health care professionals and anyone having close contact with a baby younger than 12 months.  Pregnant women should get a dose of Tdap during every pregnancy, to protect the newborn from pertussis. Infants are most at risk for severe, life-threatening  complications from pertussis.  A similar vaccine, called Td, protects from tetanus and diphtheria, but not pertussis. A Td booster should be given every 10 years. Tdap may be given as one of these boosters if you have not already gotten a dose. Tdap may also be given after a severe cut or burn to prevent tetanus infection.  Your doctor can give you more information.  Tdap may safely be given at the same time as other vaccines.  SOME PEOPLE SHOULD NOT GET THIS VACCINE  · If you ever had a life-threatening allergic reaction after a dose of any tetanus, diphtheria, or pertussis containing vaccine, OR if you have a severe allergy to any part of this vaccine, you should not get Tdap. Tell your doctor if you have any severe allergies.  · If you had a coma, or long or multiple seizures within 7 days after a childhood dose of DTP or DTaP, you should not get Tdap, unless a cause other than the vaccine was found. You can still get Td.  · Talk to your doctor if you:  ¨ have epilepsy or another nervous system problem,  ¨ had severe pain or swelling after any vaccine containing diphtheria, tetanus or pertussis,  ¨ ever had Guillain-Barré Syndrome (GBS),  ¨ aren't feeling well on the day the shot is scheduled.  RISKS OF A VACCINE REACTION  With any medicine, including vaccines, there is a chance of side effects. These are usually mild and go away on their own, but serious reactions are also possible.  Brief fainting spells can follow   a vaccination, leading to injuries from falling. Sitting or lying down for about 15 minutes can help prevent these. Tell your doctor if you feel dizzy or light-headed, or have vision changes or ringing in the ears.  Mild problems following Tdap (Did not interfere with activities)  · Pain where the shot was given (about 3 in 4 adolescents or 2 in 3 adults)  · Redness or swelling where the shot was given (about 1 person in 5)  · Mild fever of at least 100.4°F (up to about 1 in 25 adolescents or 1 in  100 adults)  · Headache (about 3 or 4 people in 10)  · Tiredness (about 1 person in 3 or 4)  · Nausea, vomiting, diarrhea, stomach ache (up to 1 in 4 adolescents or 1 in 10 adults)  · Chills, body aches, sore joints, rash, swollen glands (uncommon)  Moderate problems following Tdap (Interfered with activities, but did not require medical attention)  · Pain where the shot was given (about 1 in 5 adolescents or 1 in 100 adults)  · Redness or swelling where the shot was given (up to about 1 in 16 adolescents or 1 in 25 adults)  · Fever over 102°F (about 1 in 100 adolescents or 1 in 250 adults)  · Headache (about 3 in 20 adolescents or 1 in 10 adults)  · Nausea, vomiting, diarrhea, stomach ache (up to 1 or 3 people in 100)  · Swelling of the entire arm where the shot was given (up to about 3 in 100).  Severe problems following Tdap (Unable to perform usual activities, required medical attention)  · Swelling, severe pain, bleeding and redness in the arm where the shot was given (rare).  A severe allergic reaction could occur after any vaccine (estimated less than 1 in a million doses).  WHAT IF THERE IS A SERIOUS REACTION?  What should I look for?  · Look for anything that concerns you, such as signs of a severe allergic reaction, very high fever, or behavior changes.  Signs of a severe allergic reaction can include hives, swelling of the face and throat, difficulty breathing, a fast heartbeat, dizziness, and weakness. These would start a few minutes to a few hours after the vaccination.  What should I do?  · If you think it is a severe allergic reaction or other emergency that can't wait, call 9-1-1 or get the person to the nearest hospital. Otherwise, call your doctor.  · Afterward, the reaction should be reported to the "Vaccine Adverse Event Reporting System" (VAERS). Your doctor might file this report, or you can do it yourself through the VAERS web site at www.vaers.hhs.gov, or by calling 1-800-822-7967.  VAERS is  only for reporting reactions. They do not give medical advice.   THE NATIONAL VACCINE INJURY COMPENSATION PROGRAM  The National Vaccine Injury Compensation Program (VICP) is a federal program that was created to compensate people who may have been injured by certain vaccines.  Persons who believe they may have been injured by a vaccine can learn about the program and about filing a claim by calling 1-800-338-2382 or visiting the VICP website at www.hrsa.gov/vaccinecompensation.  HOW CAN I LEARN MORE?  · Ask your doctor.  · Call your local or state health department.  · Contact the Centers for Disease Control and Prevention (CDC):  ¨ Call 1-800-232-4636 or visit CDC's website at www.cdc.gov/vaccines.  CDC Tdap Vaccine VIS (08/11/11)  Document Released: 09/20/2011 Document Revised: 07/16/2012 Document Reviewed: 07/11/2012  [employer]®   Patient Information ©2015 [employer]. This information is not intended to replace advice given to you by your health care provider. Make sure you discuss any questions you have with your health care provider.

## 2013-10-02 NOTE — Progress Notes (Signed)
tdap given and tolerated well.  

## 2013-11-07 ENCOUNTER — Telehealth: Payer: Self-pay | Admitting: Family Medicine

## 2013-11-08 NOTE — Telephone Encounter (Signed)
appt scheduled for tues with bill

## 2013-11-12 ENCOUNTER — Encounter: Payer: Self-pay | Admitting: Family Medicine

## 2013-11-12 ENCOUNTER — Ambulatory Visit (INDEPENDENT_AMBULATORY_CARE_PROVIDER_SITE_OTHER): Payer: BC Managed Care – PPO | Admitting: Family Medicine

## 2013-11-12 VITALS — BP 137/73 | HR 58 | Temp 98.3°F | Ht 63.5 in | Wt 218.4 lb

## 2013-11-12 DIAGNOSIS — I10 Essential (primary) hypertension: Secondary | ICD-10-CM

## 2013-11-12 MED ORDER — ATENOLOL 50 MG PO TABS: 50.0000 mg | ORAL_TABLET | Freq: Every day | ORAL | Status: DC

## 2013-11-12 MED ORDER — AMLODIPINE BESYLATE 5 MG PO TABS: 5.0000 mg | ORAL_TABLET | Freq: Every day | ORAL | Status: DC

## 2013-11-12 MED ORDER — HYDROCHLOROTHIAZIDE 25 MG PO TABS: 25.0000 mg | ORAL_TABLET | Freq: Every day | ORAL | Status: DC

## 2013-11-12 NOTE — Progress Notes (Signed)
   Subjective:    Patient ID: Fredia BeetsJoyce Mary Mcmullen, female    DOB: 03/31/1956, 58 y.o.   MRN: [id number]  HPI  This 58 y.o. female presents for evaluation of HTN.  Review of Systems    No chest pain, SOB, HA, dizziness, vision change, N/V, diarrhea, constipation, dysuria, urinary urgency or frequency, myalgias, arthralgias or rash.  Objective:   Physical Exam  Vital signs noted  Well developed well nourished female.  HEENT - Head atraumatic Normocephalic                Eyes - PERRLA, Conjuctiva - clear Sclera- Clear EOMI                Ears - EAC's Wnl TM's Wnl Gross Hearing WNL                Nose - Nares patent                 Throat - oropharanx wnl Respiratory - Lungs CTA bilateral Cardiac - RRR S1 and S2 without murmur GI - Abdomen soft Nontender and bowel sounds active x 4 Extremities - No edema. Neuro - Grossly intact.      Assessment & Plan:  HTN - Refill norvasc, atenolol, and hctz for a year and follow up in one year and prn  Deatra CanterWilliam J Oxford FNP

## 2014-09-10 ENCOUNTER — Encounter: Payer: Self-pay | Admitting: Family

## 2014-09-10 ENCOUNTER — Ambulatory Visit (INDEPENDENT_AMBULATORY_CARE_PROVIDER_SITE_OTHER): Payer: BLUE CROSS/BLUE SHIELD

## 2014-09-10 ENCOUNTER — Ambulatory Visit (INDEPENDENT_AMBULATORY_CARE_PROVIDER_SITE_OTHER): Payer: BLUE CROSS/BLUE SHIELD | Admitting: Family

## 2014-09-10 VITALS — BP 133/87 | HR 60 | Temp 97.0°F | Ht 63.5 in | Wt 220.0 lb

## 2014-09-10 DIAGNOSIS — E785 Hyperlipidemia, unspecified: Secondary | ICD-10-CM

## 2014-09-10 DIAGNOSIS — Z780 Asymptomatic menopausal state: Secondary | ICD-10-CM

## 2014-09-10 DIAGNOSIS — Z0000 Encounter for general adult medical examination without abnormal findings: Secondary | ICD-10-CM

## 2014-09-10 DIAGNOSIS — I10 Essential (primary) hypertension: Secondary | ICD-10-CM

## 2014-09-10 DIAGNOSIS — K219 Gastro-esophageal reflux disease without esophagitis: Secondary | ICD-10-CM | POA: Diagnosis not present

## 2014-09-10 DIAGNOSIS — Z01419 Encounter for gynecological examination (general) (routine) without abnormal findings: Secondary | ICD-10-CM

## 2014-09-10 LAB — POCT CBC
GRANULOCYTE PERCENT: 62.5 % (ref 37–80)
HCT, POC: 42.9 % (ref 37.7–47.9)
Hemoglobin: 13.8 g/dL (ref 12.2–16.2)
Lymph, poc: 3.2 (ref 0.6–3.4)
MCH: 28.9 pg (ref 27–31.2)
MCHC: 32 g/dL (ref 31.8–35.4)
MCV: 90.1 fL (ref 80–97)
MPV: 8 fL (ref 0–99.8)
POC Granulocyte: 6.1 (ref 2–6.9)
POC LYMPH %: 33.1 % (ref 10–50)
Platelet Count, POC: 285 10*3/uL (ref 142–424)
RBC: 4.76 M/uL (ref 4.04–5.48)
RDW, POC: 13.6 %
WBC: 9.8 10*3/uL (ref 4.6–10.2)

## 2014-09-10 LAB — POCT URINALYSIS DIPSTICK
BILIRUBIN UA: NEGATIVE
Glucose, UA: NEGATIVE
Ketones, UA: NEGATIVE
Leukocytes, UA: NEGATIVE
Nitrite, UA: NEGATIVE
RBC UA: NEGATIVE
Spec Grav, UA: 1.025
UROBILINOGEN UA: NEGATIVE
pH, UA: 6

## 2014-09-10 LAB — POCT UA - MICROSCOPIC ONLY
CRYSTALS, UR, HPF, POC: NEGATIVE
Casts, Ur, LPF, POC: NEGATIVE
RBC, urine, microscopic: NEGATIVE
WBC, UR, HPF, POC: 1.3
Yeast, UA: NEGATIVE

## 2014-09-10 MED ORDER — OMEPRAZOLE MAGNESIUM 20 MG PO TBEC: 20.0000 mg | DELAYED_RELEASE_TABLET | Freq: Every day | ORAL | Status: DC

## 2014-09-10 MED ORDER — AMLODIPINE BESYLATE 5 MG PO TABS: 5.0000 mg | ORAL_TABLET | Freq: Every day | ORAL | Status: DC

## 2014-09-10 MED ORDER — HYDROCHLOROTHIAZIDE 25 MG PO TABS
25.0000 mg | ORAL_TABLET | Freq: Every day | ORAL | Status: DC
Start: 2014-09-10 — End: 2015-08-27

## 2014-09-10 MED ORDER — ATENOLOL 50 MG PO TABS: 50.0000 mg | ORAL_TABLET | Freq: Every day | ORAL | Status: DC

## 2014-09-10 NOTE — Addendum Note (Signed)
Addended by: Prescott GumLAND, Nazaiah Navarrete M on: 09/10/2014 12:41 PM   Modules accepted: Kipp BroodSmartSet

## 2014-09-10 NOTE — Addendum Note (Signed)
Addended by: Jannifer RodneyHAWKS, Dorea Duff A on: 09/10/2014 12:42 PM   Modules accepted: Orders

## 2014-09-10 NOTE — Patient Instructions (Signed)
Ovarian Cyst An ovarian cyst is a fluid-filled sac that forms on an ovary. The ovaries are small organs that produce eggs in women. Various types of cysts can form on the ovaries. Most are not cancerous. Many do not cause problems, and they often go away on their own. Some may cause symptoms and require treatment. Common types of ovarian cysts include:  Functional cysts--These cysts may occur every month during the menstrual cycle. This is normal. The cysts usually go away with the next menstrual cycle if the woman does not get pregnant. Usually, there are no symptoms with a functional cyst.  Endometrioma cysts--These cysts form from the tissue that lines the uterus. They are also called "chocolate cysts" because they become filled with blood that turns brown. This type of cyst can cause pain in the lower abdomen during intercourse and with your menstrual period.  Cystadenoma cysts--This type develops from the cells on the outside of the ovary. These cysts can get very big and cause lower abdomen pain and pain with intercourse. This type of cyst can twist on itself, cut off its blood supply, and cause severe pain. It can also easily rupture and cause a lot of pain.  Dermoid cysts--This type of cyst is sometimes found in both ovaries. These cysts may contain different kinds of body tissue, such as skin, teeth, hair, or cartilage. They usually do not cause symptoms unless they get very big.  Theca lutein cysts--These cysts occur when too much of a certain hormone (human chorionic gonadotropin) is produced and overstimulates the ovaries to produce an egg. This is most common after procedures used to assist with the conception of a baby (in vitro fertilization). CAUSES   Fertility drugs can cause a condition in which multiple large cysts are formed on the ovaries. This is called ovarian hyperstimulation syndrome.  A condition called polycystic ovary syndrome can cause hormonal imbalances that can lead to  nonfunctional ovarian cysts. SIGNS AND SYMPTOMS  Many ovarian cysts do not cause symptoms. If symptoms are present, they may include:  Pelvic pain or pressure.  Pain in the lower abdomen.  Pain during sexual intercourse.  Increasing girth (swelling) of the abdomen.  Abnormal menstrual periods.  Increasing pain with menstrual periods.  Stopping having menstrual periods without being pregnant. DIAGNOSIS  These cysts are commonly found during a routine or annual pelvic exam. Tests may be ordered to find out more about the cyst. These tests may include:  Ultrasound.  X-ray of the pelvis.  CT scan.  MRI.  Blood tests. TREATMENT  Many ovarian cysts go away on their own without treatment. Your health care provider may want to check your cyst regularly for 2-3 months to see if it changes. For women in menopause, it is particularly important to monitor a cyst closely because of the higher rate of ovarian cancer in menopausal women. When treatment is needed, it may include any of the following:  A procedure to drain the cyst (aspiration). This may be done using a long needle and ultrasound. It can also be done through a laparoscopic procedure. This involves using a thin, lighted tube with a tiny camera on the end (laparoscope) inserted through a small incision.  Surgery to remove the whole cyst. This may be done using laparoscopic surgery or an open surgery involving a larger incision in the lower abdomen.  Hormone treatment or birth control pills. These methods are sometimes used to help dissolve a cyst. HOME CARE INSTRUCTIONS   Only take over-the-counter  or prescription medicines as directed by your health care provider.  Follow up with your health care provider as directed.  Get regular pelvic exams and Pap tests. SEEK MEDICAL CARE IF:   Your periods are late, irregular, or painful, or they stop.  Your pelvic pain or abdominal pain does not go away.  Your abdomen becomes  larger or swollen.  You have pressure on your bladder or trouble emptying your bladder completely.  You have pain during sexual intercourse.  You have feelings of fullness, pressure, or discomfort in your stomach.  You lose weight for no apparent reason.  You feel generally ill.  You become constipated.  You lose your appetite.  You develop acne.  You have an increase in body and facial hair.  You are gaining weight, without changing your exercise and eating habits.  You think you are pregnant. SEEK IMMEDIATE MEDICAL CARE IF:   You have increasing abdominal pain.  You feel sick to your stomach (nauseous), and you throw up (vomit).  You develop a fever that comes on suddenly.  You have abdominal pain during a bowel movement.  Your menstrual periods become heavier than usual. MAKE SURE YOU:  Understand these instructions.  Will watch your condition.  Will get help right away if you are not doing well or get worse. Document Released: 03/21/2005 Document Revised: 03/26/2013 Document Reviewed: 11/26/2012 [facility] Patient Information 2015 [place], [place]. This information is not intended to replace advice given to you by your health care provider. Make sure you discuss any questions you have with your health care provider. Health Maintenance Adopting a healthy lifestyle and getting preventive care can go a long way to promote health and wellness. Talk with your health care provider about what schedule of regular examinations is right for you. This is a good chance for you to check in with your provider about disease prevention and staying healthy. In between checkups, there are plenty of things you can do on your own. Experts have done a lot of research about which lifestyle changes and preventive measures are most likely to keep you healthy. Ask your health care provider for more information. WEIGHT AND DIET  Eat a healthy diet  Be sure to include plenty of vegetables,  fruits, low-fat dairy products, and lean protein.  Do not eat a lot of foods high in solid fats, added sugars, or salt.  Get regular exercise. This is one of the most important things you can do for your health.  Most adults should exercise for at least 150 minutes each week. The exercise should increase your heart rate and make you sweat (moderate-intensity exercise).  Most adults should also do strengthening exercises at least twice a week. This is in addition to the moderate-intensity exercise.  Maintain a healthy weight  Body mass index (BMI) is a measurement that can be used to identify possible weight problems. It estimates body fat based on height and weight. Your health care provider can help determine your BMI and help you achieve or maintain a healthy weight.  For females 63 years of age and older:   A BMI below 18.5 is considered underweight.  A BMI of 18.5 to 24.9 is normal.  A BMI of 25 to 29.9 is considered overweight.  A BMI of 30 and above is considered obese.  Watch levels of cholesterol and blood lipids  You should start having your blood tested for lipids and cholesterol at 60 years of age, then have this test every 5  years.  You may need to have your cholesterol levels checked more often if:  Your lipid or cholesterol levels are high.  You are older than 60 years of age.  You are at high risk for heart disease.  CANCER SCREENING   Lung Cancer  Lung cancer screening is recommended for adults 6-32 years old who are at high risk for lung cancer because of a history of smoking.  A yearly low-dose CT scan of the lungs is recommended for people who:  Currently smoke.  Have quit within the past 15 years.  Have at least a 30-pack-year history of smoking. A pack year is smoking an average of one pack of cigarettes a day for 1 year.  Yearly screening should continue until it has been 15 years since you quit.  Yearly screening should stop if you develop  a health problem that would prevent you from having lung cancer treatment.  Breast Cancer  Practice breast self-awareness. This means understanding how your breasts normally appear and feel.  It also means doing regular breast self-exams. Let your health care provider know about any changes, no matter how small.  If you are in your 20s or 30s, you should have a clinical breast exam (CBE) by a health care provider every 1-3 years as part of a regular health exam.  If you are 27 or older, have a CBE every year. Also consider having a breast X-ray (mammogram) every year.  If you have a family history of breast cancer, talk to your health care provider about genetic screening.  If you are at high risk for breast cancer, talk to your health care provider about having an MRI and a mammogram every year.  Breast cancer gene (BRCA) assessment is recommended for women who have family members with BRCA-related cancers. BRCA-related cancers include:  Breast.  Ovarian.  Tubal.  Peritoneal cancers.  Results of the assessment will determine the need for genetic counseling and BRCA1 and BRCA2 testing. Cervical Cancer Routine pelvic examinations to screen for cervical cancer are no longer recommended for nonpregnant women who are considered low risk for cancer of the pelvic organs (ovaries, uterus, and vagina) and who do not have symptoms. A pelvic examination may be necessary if you have symptoms including those associated with pelvic infections. Ask your health care provider if a screening pelvic exam is right for you.   The Pap test is the screening test for cervical cancer for women who are considered at risk.  If you had a hysterectomy for a problem that was not cancer or a condition that could lead to cancer, then you no longer need Pap tests.  If you are older than 65 years, and you have had normal Pap tests for the past 10 years, you no longer need to have Pap tests.  If you have had past  treatment for cervical cancer or a condition that could lead to cancer, you need Pap tests and screening for cancer for at least 20 years after your treatment.  If you no longer get a Pap test, assess your risk factors if they change (such as having a new sexual partner). This can affect whether you should start being screened again.  Some women have medical problems that increase their chance of getting cervical cancer. If this is the case for you, your health care provider may recommend more frequent screening and Pap tests.  The human papillomavirus (HPV) test is another test that may be used for cervical cancer screening.  The HPV test looks for the virus that can cause cell changes in the cervix. The cells collected during the Pap test can be tested for HPV.  The HPV test can be used to screen women 82 years of age and older. Getting tested for HPV can extend the interval between normal Pap tests from three to five years.  An HPV test also should be used to screen women of any age who have unclear Pap test results.  After 60 years of age, women should have HPV testing as often as Pap tests.  Colorectal Cancer  This type of cancer can be detected and often prevented.  Routine colorectal cancer screening usually begins at 60 years of age and continues through 60 years of age.  Your health care provider may recommend screening at an earlier age if you have risk factors for colon cancer.  Your health care provider may also recommend using home test kits to check for hidden blood in the stool.  A small camera at the end of a tube can be used to examine your colon directly (sigmoidoscopy or colonoscopy). This is done to check for the earliest forms of colorectal cancer.  Routine screening usually begins at age 29.  Direct examination of the colon should be repeated every 5-10 years through 60 years of age. However, you may need to be screened more often if early forms of precancerous polyps  or small growths are found. Skin Cancer  Check your skin from head to toe regularly.  Tell your health care provider about any new moles or changes in moles, especially if there is a change in a mole's shape or color.  Also tell your health care provider if you have a mole that is larger than the size of a pencil eraser.  Always use sunscreen. Apply sunscreen liberally and repeatedly throughout the day.  Protect yourself by wearing long sleeves, pants, a wide-brimmed hat, and sunglasses whenever you are outside. HEART DISEASE, DIABETES, AND HIGH BLOOD PRESSURE   Have your blood pressure checked at least every 1-2 years. High blood pressure causes heart disease and increases the risk of stroke.  If you are between 59 years and 33 years old, ask your health care provider if you should take aspirin to prevent strokes.  Have regular diabetes screenings. This involves taking a blood sample to check your fasting blood sugar level.  If you are at a normal weight and have a low risk for diabetes, have this test once every three years after 60 years of age.  If you are overweight and have a high risk for diabetes, consider being tested at a younger age or more often. PREVENTING INFECTION  Hepatitis B  If you have a higher risk for hepatitis B, you should be screened for this virus. You are considered at high risk for hepatitis B if:  You were born in a country where hepatitis B is common. Ask your health care provider which countries are considered high risk.  Your parents were born in a high-risk country, and you have not been immunized against hepatitis B (hepatitis B vaccine).  You have HIV or AIDS.  You use needles to inject street drugs.  You live with someone who has hepatitis B.  You have had sex with someone who has hepatitis B.  You get hemodialysis treatment.  You take certain medicines for conditions, including cancer, organ transplantation, and autoimmune  conditions. Hepatitis C  Blood testing is recommended for:  Everyone born from  1946 through 24.  Anyone with known risk factors for hepatitis C. Sexually transmitted infections (STIs)  You should be screened for sexually transmitted infections (STIs) including gonorrhea and chlamydia if:  You are sexually active and are younger than 60 years of age.  You are older than 60 years of age and your health care provider tells you that you are at risk for this type of infection.  Your sexual activity has changed since you were last screened and you are at an increased risk for chlamydia or gonorrhea. Ask your health care provider if you are at risk.  If you do not have HIV, but are at risk, it may be recommended that you take a prescription medicine daily to prevent HIV infection. This is called pre-exposure prophylaxis (PrEP). You are considered at risk if:  You are sexually active and do not regularly use condoms or know the HIV status of your partner(s).  You take drugs by injection.  You are sexually active with a partner who has HIV. Talk with your health care provider about whether you are at high risk of being infected with HIV. If you choose to begin PrEP, you should first be tested for HIV. You should then be tested every 3 months for as long as you are taking PrEP.  PREGNANCY   If you are premenopausal and you may become pregnant, ask your health care provider about preconception counseling.  If you may become pregnant, take 400 to 800 micrograms (mcg) of folic acid every day.  If you want to prevent pregnancy, talk to your health care provider about birth control (contraception). OSTEOPOROSIS AND MENOPAUSE   Osteoporosis is a disease in which the bones lose minerals and strength with aging. This can result in serious bone fractures. Your risk for osteoporosis can be identified using a bone density scan.  If you are 73 years of age or older, or if you are at risk for  osteoporosis and fractures, ask your health care provider if you should be screened.  Ask your health care provider whether you should take a calcium or vitamin D supplement to lower your risk for osteoporosis.  Menopause may have certain physical symptoms and risks.  Hormone replacement therapy may reduce some of these symptoms and risks. Talk to your health care provider about whether hormone replacement therapy is right for you.  HOME CARE INSTRUCTIONS   Schedule regular health, dental, and eye exams.  Stay current with your immunizations.   Do not use any tobacco products including cigarettes, chewing tobacco, or electronic cigarettes.  If you are pregnant, do not drink alcohol.  If you are breastfeeding, limit how much and how often you drink alcohol.  Limit alcohol intake to no more than 1 drink per day for nonpregnant women. One drink equals 12 ounces of beer, 5 ounces of wine, or 1 ounces of hard liquor.  Do not use street drugs.  Do not share needles.  Ask your health care provider for help if you need support or information about quitting drugs.  Tell your health care provider if you often feel depressed.  Tell your health care provider if you have ever been abused or do not feel safe at home. Document Released: 10/04/2010 Document Revised: 08/05/2013 Document Reviewed: 02/20/2013 [facility] Patient Information 2015 [place], [place]. This information is not intended to replace advice given to you by your health care provider. Make sure you discuss any questions you have with your health care provider.

## 2014-09-10 NOTE — Progress Notes (Addendum)
Subjective:    Patient ID: Carla Simmons, female    DOB: 07/03/1955, 59 y.o.   MRN: [id number]  Pt presents to the office today for CPE with pap.  Gynecologic Exam Pertinent negatives include no headaches, nausea or sore throat.  Hypertension This is a chronic problem. The current episode started more than 1 year ago. The problem has been resolved since onset. The problem is controlled. Pertinent negatives include no anxiety, headaches, palpitations, peripheral edema or shortness of breath. Risk factors for coronary artery disease include dyslipidemia, post-menopausal state, obesity, smoking/tobacco exposure, family history and sedentary lifestyle. Past treatments include beta blockers, calcium channel blockers and diuretics. The current treatment provides significant improvement. There is no history of kidney disease, CAD/MI, CVA, heart failure or a thyroid problem. There is no history of sleep apnea.  Hyperlipidemia This is a chronic problem. The current episode started more than 1 year ago. The problem is uncontrolled. Recent lipid tests were reviewed and are high. She has no history of diabetes. Factors aggravating her hyperlipidemia include smoking and fatty foods. Pertinent negatives include no shortness of breath. Current antihyperlipidemic treatment includes diet change and herbal therapy. The current treatment provides no improvement of lipids. Risk factors for coronary artery disease include dyslipidemia, family history, obesity, hypertension and post-menopausal.  Gastrophageal Reflux She reports no belching, no coughing, no nausea or no sore throat. This is a chronic problem. The current episode started more than 1 year ago. The problem occurs rarely. The problem has been resolved. The symptoms are aggravated by certain foods. She has tried a PPI for the symptoms. The treatment provided significant relief.      Review of Systems  Constitutional: Negative.   HENT: Negative.  Negative  for sore throat.   Eyes: Negative.   Respiratory: Negative.  Negative for cough and shortness of breath.   Cardiovascular: Negative.  Negative for palpitations.  Gastrointestinal: Negative.  Negative for nausea.  Endocrine: Negative.   Genitourinary: Negative.   Musculoskeletal: Negative.   Neurological: Negative.  Negative for headaches.  Hematological: Negative.   Psychiatric/Behavioral: Negative.   All other systems reviewed and are negative.      Objective:   Physical Exam  Constitutional: She is oriented to person, place, and time. She appears well-developed and well-nourished. No distress.  HENT:  Head: Normocephalic and atraumatic.  Right Ear: External ear normal.  Left Ear: External ear normal.  Nose: Nose normal.  Mouth/Throat: Oropharynx is clear and moist.  Eyes: Pupils are equal, round, and reactive to light.  Neck: Normal range of motion. Neck supple. No thyromegaly present.  Cardiovascular: Normal rate, regular rhythm, normal heart sounds and intact distal pulses.   No murmur heard. Pulmonary/Chest: Effort normal and breath sounds normal. No respiratory distress. She has no wheezes. Right breast exhibits no inverted nipple, no mass, no nipple discharge, no skin change and no tenderness. Left breast exhibits no inverted nipple, no mass, no nipple discharge, no skin change and no tenderness. Breasts are symmetrical.  Abdominal: Soft. Bowel sounds are normal. She exhibits no distension. There is no tenderness.  Genitourinary: Vagina normal. No vaginal discharge found.  Bimanual exam- no adnexal masses or tenderness, ovaries nonpalpable   Cervix parous and pink- No discharge   Musculoskeletal: Normal range of motion. She exhibits no edema or tenderness.  Neurological: She is alert and oriented to person, place, and time. She has normal reflexes. No cranial nerve deficit.  Skin: Skin is warm and dry.  Psychiatric: She has a  normal mood and affect. Her behavior is  normal. Judgment and thought content normal.  Vitals reviewed.     BP 133/87 mmHg  Pulse 60  Temp(Src) 97 F (36.1 C) (Oral)  Ht 5' 3.5" (1.613 m)  Wt 220 lb (99.791 kg)  BMI 38.36 kg/m2     Assessment & Plan:  1. Encounter for routine gynecological examination - POCT UA - Microscopic Only - POCT urinalysis dipstick - CMP14+EGFR  2. Essential hypertension - CMP14+EGFR - amLODipine (NORVASC) 5 MG tablet; Take 1 tablet (5 mg total) by mouth daily.  Dispense: 90 tablet; Refill: 3 - atenolol (TENORMIN) 50 MG tablet; Take 1 tablet (50 mg total) by mouth daily.  Dispense: 90 tablet; Refill: 3 - hydrochlorothiazide (HYDRODIURIL) 25 MG tablet; Take 1 tablet (25 mg total) by mouth daily.  Dispense: 90 tablet; Refill: 3  3. Gastroesophageal reflux disease, esophagitis presence not specified - CMP14+EGFR - omeprazole (PRILOSEC OTC) 20 MG tablet; Take 1 tablet (20 mg total) by mouth daily.  Dispense: 90 tablet; Refill: 3  4. HLD (hyperlipidemia) - CMP14+EGFR - Lipid panel  5. Annual physical exam - CMP14+EGFR - Lipid panel - Thyroid Panel With TSH - Vit D  25 hydroxy (rtn osteoporosis monitoring) - POCT CBC - Pap IG w/ reflex to HPV when ASC-U  6. Encounter for gynecological examination - CMP14+EGFR - Pap IG w/ reflex to HPV when ASC-U  7. Post-menopausal - DG Bone Density; Future  Continue all meds Labs pending Health Maintenance reviewed Diet and exercise encouraged RTO 1 year  Evelina Dun, FNP

## 2014-09-11 ENCOUNTER — Other Ambulatory Visit: Payer: Self-pay | Admitting: Family

## 2014-09-11 DIAGNOSIS — M8580 Other specified disorders of bone density and structure, unspecified site: Secondary | ICD-10-CM | POA: Insufficient documentation

## 2014-09-11 LAB — CMP14+EGFR
ALT: 20 IU/L (ref 0–32)
AST: 20 IU/L (ref 0–40)
Albumin/Globulin Ratio: 1.4 (ref 1.1–2.5)
Albumin: 4 g/dL (ref 3.5–5.5)
Alkaline Phosphatase: 82 IU/L (ref 39–117)
BILIRUBIN TOTAL: 0.5 mg/dL (ref 0.0–1.2)
BUN / CREAT RATIO: 24 — AB (ref 9–23)
BUN: 17 mg/dL (ref 6–24)
CALCIUM: 9.4 mg/dL (ref 8.7–10.2)
CO2: 27 mmol/L (ref 18–29)
Chloride: 101 mmol/L (ref 97–108)
Creatinine, Ser: 0.7 mg/dL (ref 0.57–1.00)
GFR calc Af Amer: 110 mL/min/{1.73_m2} (ref >59)
GFR calc non Af Amer: 95 mL/min/{1.73_m2} (ref >59)
GLOBULIN, TOTAL: 2.8 g/dL (ref 1.5–4.5)
Glucose: 105 mg/dL — ABNORMAL HIGH (ref 65–99)
Potassium: 3.8 mmol/L (ref 3.5–5.2)
Sodium: 143 mmol/L (ref 134–144)
Total Protein: 6.8 g/dL (ref 6.0–8.5)

## 2014-09-11 LAB — THYROID PANEL WITH TSH
Free Thyroxine Index: 2.3 (ref 1.2–4.9)
T3 Uptake Ratio: 26 % (ref 24–39)
T4, Total: 8.8 ug/dL (ref 4.5–12.0)
TSH: 2.84 u[IU]/mL (ref 0.450–4.500)

## 2014-09-11 LAB — LIPID PANEL
CHOL/HDL RATIO: 4.7 ratio — AB (ref 0.0–4.4)
Cholesterol, Total: 207 mg/dL — ABNORMAL HIGH (ref 100–199)
HDL: 44 mg/dL (ref >39)
LDL Calculated: 136 mg/dL — ABNORMAL HIGH (ref 0–99)
Triglycerides: 136 mg/dL (ref 0–149)
VLDL CHOLESTEROL CAL: 27 mg/dL (ref 5–40)

## 2014-09-11 LAB — PAP IG W/ RFLX HPV ASCU: PAP Smear Comment: 0

## 2014-09-11 LAB — VITAMIN D 25 HYDROXY (VIT D DEFICIENCY, FRACTURES): Vit D, 25-Hydroxy: 34.1 ng/mL (ref 30.0–100.0)

## 2014-09-11 MED ORDER — SIMVASTATIN 20 MG PO TABS: 20.0000 mg | ORAL_TABLET | Freq: Every day | ORAL | Status: DC

## 2015-03-18 ENCOUNTER — Other Ambulatory Visit: Payer: Self-pay | Admitting: Family

## 2015-03-18 NOTE — Telephone Encounter (Signed)
Last seen and last lipid 09/10/14  Christy  Requesting 90 day supply

## 2015-03-19 NOTE — Telephone Encounter (Signed)
Aware, need a follow up visit to have more refills .

## 2015-03-19 NOTE — Telephone Encounter (Signed)
Pt needs follow up appt

## 2015-03-31 ENCOUNTER — Ambulatory Visit (INDEPENDENT_AMBULATORY_CARE_PROVIDER_SITE_OTHER): Payer: BLUE CROSS/BLUE SHIELD | Admitting: Family Medicine

## 2015-03-31 ENCOUNTER — Encounter: Payer: Self-pay | Admitting: Family Medicine

## 2015-03-31 VITALS — BP 150/90 | HR 61 | Temp 97.4°F | Ht 63.5 in | Wt 223.8 lb

## 2015-03-31 DIAGNOSIS — M25561 Pain in right knee: Secondary | ICD-10-CM | POA: Diagnosis not present

## 2015-03-31 MED ORDER — MELOXICAM 15 MG PO TABS: 15.0000 mg | ORAL_TABLET | Freq: Every day | ORAL | Status: DC

## 2015-03-31 NOTE — Progress Notes (Signed)
   Subjective:    Patient ID: Carla BeetsJoyce Mary Rabenold, female    DOB: 06/30/1955, 59 y.o.   MRN: [id number]  HPI 59 year old female with pain in her right knee. Stands on it for 12 hours a day. The pain is really behind her knee. There is no giving way or locking or grinding or excessive noises coming from the knee when she ambulates or goes up and down stairs. She denies any swelling.  Patient Active Problem List   Diagnosis Date Noted  . Osteopenia 09/11/2014  . Palpitations 06/28/2013  . PVC's (premature ventricular contractions) 06/28/2013  . HTN (hypertension) 04/16/2013  . HLD (hyperlipidemia) 04/16/2013  . GERD (gastroesophageal reflux disease) 04/16/2013   Outpatient Encounter Prescriptions as of 03/31/2015  Medication Sig  . amLODipine (NORVASC) 5 MG tablet Take 1 tablet (5 mg total) by mouth daily.  Marland Kitchen. aspirin EC 81 MG tablet Take 81 mg by mouth daily.  Marland Kitchen. atenolol (TENORMIN) 50 MG tablet Take 1 tablet (50 mg total) by mouth daily.  . Ferrous Sulfate (IRON) 325 (65 FE) MG TABS Take by mouth.  . Garlic 1000 MG CAPS Take 1,000 mg by mouth daily.  . hydrochlorothiazide (HYDRODIURIL) 25 MG tablet Take 1 tablet (25 mg total) by mouth daily.  . Magnesium 250 MG TABS Take by mouth.  . Multiple Vitamins-Minerals (MULTIVITAMIN WITH MINERALS) tablet Take 1 tablet by mouth daily.  . Omega-3 Fatty Acids (FISH OIL) 1200 MG CAPS Take 1,200 mg by mouth daily.  Marland Kitchen. omeprazole (PRILOSEC OTC) 20 MG tablet Take 1 tablet (20 mg total) by mouth daily.  . simvastatin (ZOCOR) 20 MG tablet TAKE 1 TABLET (20 MG TOTAL) BY MOUTH DAILY.   No facility-administered encounter medications on file as of 03/31/2015.      Review of Systems  Constitutional: Negative.   Respiratory: Negative.   Cardiovascular: Negative.   Gastrointestinal: Negative.   Musculoskeletal: Positive for arthralgias.  Psychiatric/Behavioral: Negative.        Objective:   Physical Exam  Constitutional: She appears well-developed and  well-nourished.  Pulmonary/Chest: Effort normal and breath sounds normal.  Musculoskeletal:  Right knee has no crepitance can feel no swelling posteriorly although history suggests that she may have some bursitis or Bakers cyst.          Assessment & Plan:  1. Knee pain, acute, right Presume bursitis since pain is posterior. She denies any hamstring issues will try anti-inflammatory, meloxicam, 15 mg for 2 weeks. If symptoms are not resolved we'll would like to get her to see orthopedic doctors to come here.  Frederica KusterStephen M Ellisha Bankson MD

## 2015-04-14 ENCOUNTER — Ambulatory Visit: Payer: BLUE CROSS/BLUE SHIELD | Admitting: Family Medicine

## 2015-06-10 ENCOUNTER — Other Ambulatory Visit: Payer: Self-pay | Admitting: Family Medicine

## 2015-07-09 ENCOUNTER — Other Ambulatory Visit: Payer: Self-pay | Admitting: Family Medicine

## 2015-07-15 ENCOUNTER — Other Ambulatory Visit: Payer: Self-pay | Admitting: Family

## 2015-08-26 ENCOUNTER — Encounter (INDEPENDENT_AMBULATORY_CARE_PROVIDER_SITE_OTHER): Payer: Self-pay

## 2015-08-27 ENCOUNTER — Encounter: Payer: Self-pay | Admitting: Family Medicine

## 2015-08-27 ENCOUNTER — Ambulatory Visit (INDEPENDENT_AMBULATORY_CARE_PROVIDER_SITE_OTHER): Payer: BLUE CROSS/BLUE SHIELD | Admitting: Family Medicine

## 2015-08-27 VITALS — BP 133/78 | HR 60 | Temp 97.6°F | Ht 63.5 in | Wt 219.4 lb

## 2015-08-27 DIAGNOSIS — I10 Essential (primary) hypertension: Secondary | ICD-10-CM

## 2015-08-27 MED ORDER — AMLODIPINE BESYLATE 5 MG PO TABS: 5.0000 mg | ORAL_TABLET | Freq: Every day | ORAL | Status: DC

## 2015-08-27 MED ORDER — HYDROCHLOROTHIAZIDE 25 MG PO TABS: 25.0000 mg | ORAL_TABLET | Freq: Every day | ORAL | Status: DC

## 2015-08-27 MED ORDER — ATENOLOL 50 MG PO TABS: 50.0000 mg | ORAL_TABLET | Freq: Every day | ORAL | Status: DC

## 2015-08-27 MED ORDER — SIMVASTATIN 20 MG PO TABS: ORAL_TABLET | ORAL | Status: DC

## 2015-08-27 MED ORDER — MELOXICAM 7.5 MG PO TABS: 7.5000 mg | ORAL_TABLET | Freq: Every day | ORAL | Status: DC

## 2015-08-27 NOTE — Progress Notes (Signed)
Subjective:    Patient ID: Carla Simmons, female    DOB: 05-07-1955, 60 y.o.   MRN: [id number]  HPI 60 year old female who was here to follow-up lipids, blood pressure, and right knee pain. Since her last visit the knee has improved. Swelling that was present posteriorly thought to be a Baker cyst has resolved. She continues with meloxicam 7.5 mg taking on a random basis. She has lost about 5 pounds of weight the last 5 months. We will update lipids today but one year ago LDL was elevated. I think if lipids are to be treated we TREAT to goal, that is less than 100  Patient Active Problem List   Diagnosis Date Noted  . Osteopenia 09/11/2014  . Palpitations 06/28/2013  . PVC's (premature ventricular contractions) 06/28/2013  . HTN (hypertension) 04/16/2013  . HLD (hyperlipidemia) 04/16/2013  . GERD (gastroesophageal reflux disease) 04/16/2013   Outpatient Encounter Prescriptions as of 08/27/2015  Medication Sig  . amLODipine (NORVASC) 5 MG tablet Take 1 tablet (5 mg total) by mouth daily.  Marland Kitchen aspirin EC 81 MG tablet Take 81 mg by mouth daily.  Marland Kitchen atenolol (TENORMIN) 50 MG tablet Take 1 tablet (50 mg total) by mouth daily.  . Ferrous Sulfate (IRON) 325 (65 FE) MG TABS Take by mouth.  . Garlic 4627 MG CAPS Take 1,000 mg by mouth daily.  . hydrochlorothiazide (HYDRODIURIL) 25 MG tablet Take 1 tablet (25 mg total) by mouth daily.  . Magnesium 250 MG TABS Take by mouth.  . meloxicam (MOBIC) 15 MG tablet TAKE 1 TABLET (15 MG TOTAL) BY MOUTH DAILY.  . Multiple Vitamins-Minerals (MULTIVITAMIN WITH MINERALS) tablet Take 1 tablet by mouth daily.  . Omega-3 Fatty Acids (FISH OIL) 1200 MG CAPS Take 1,200 mg by mouth daily.  Marland Kitchen omeprazole (PRILOSEC OTC) 20 MG tablet Take 1 tablet (20 mg total) by mouth daily.  . simvastatin (ZOCOR) 20 MG tablet TAKE 1 TABLET (20 MG TOTAL) BY MOUTH DAILY.   No facility-administered encounter medications on file as of 08/27/2015.      Review of Systems    Constitutional: Negative.   HENT: Negative.   Respiratory: Negative.   Cardiovascular: Negative.   Genitourinary: Negative.   Psychiatric/Behavioral: Negative.        Objective:   Physical Exam  Constitutional: She is oriented to person, place, and time. She appears well-developed and well-nourished.  Cardiovascular: Normal rate, regular rhythm and normal heart sounds.   Pulmonary/Chest: Effort normal and breath sounds normal.  Abdominal: Soft.  Musculoskeletal:  Right knee: There is no swelling as before. There is no crepitance with extension and flexion of the joint  Neurological: She is alert and oriented to person, place, and time.  Psychiatric: She has a normal mood and affect.   BP 133/78 mmHg  Pulse 60  Temp(Src) 97.6 F (36.4 C) (Oral)  Ht 5' 3.5" (1.613 m)  Wt 219 lb 6.4 oz (99.519 kg)  BMI 38.25 kg/m2        Assessment & Plan:  1. Essential hypertension Blood pressure is well controlled on current regimen of amlodipine and hydrochlorothiazide. She also takes atenolol but more for palpitations and blood pressure - hydrochlorothiazide (HYDRODIURIL) 25 MG tablet; Take 1 tablet (25 mg total) by mouth daily.  Dispense: 90 tablet; Refill: 1 - atenolol (TENORMIN) 50 MG tablet; Take 1 tablet (50 mg total) by mouth daily.  Dispense: 90 tablet; Refill: 1 - amLODipine (NORVASC) 5 MG tablet; Take 1 tablet (5 mg total) by mouth  daily.  Dispense: 90 tablet; Refill: 1 - CMP14+EGFR - Lipid panel  Wardell Honour MD

## 2015-08-28 LAB — CMP14+EGFR
ALBUMIN: 4.1 g/dL (ref 3.6–4.8)
ALK PHOS: 88 IU/L (ref 39–117)
ALT: 24 IU/L (ref 0–32)
AST: 21 IU/L (ref 0–40)
Albumin/Globulin Ratio: 1.4 (ref 1.2–2.2)
BILIRUBIN TOTAL: 0.6 mg/dL (ref 0.0–1.2)
BUN / CREAT RATIO: 23 (ref 12–28)
BUN: 16 mg/dL (ref 8–27)
CO2: 26 mmol/L (ref 18–29)
CREATININE: 0.71 mg/dL (ref 0.57–1.00)
Calcium: 9.5 mg/dL (ref 8.7–10.3)
Chloride: 103 mmol/L (ref 96–106)
GFR calc non Af Amer: 93 mL/min/{1.73_m2} (ref >59)
GFR, EST AFRICAN AMERICAN: 107 mL/min/{1.73_m2} (ref >59)
GLOBULIN, TOTAL: 2.9 g/dL (ref 1.5–4.5)
Glucose: 109 mg/dL — ABNORMAL HIGH (ref 65–99)
Potassium: 4.4 mmol/L (ref 3.5–5.2)
SODIUM: 147 mmol/L — AB (ref 134–144)
TOTAL PROTEIN: 7 g/dL (ref 6.0–8.5)

## 2015-08-28 LAB — LIPID PANEL
CHOLESTEROL TOTAL: 175 mg/dL (ref 100–199)
Chol/HDL Ratio: 4.2 ratio units (ref 0.0–4.4)
HDL: 42 mg/dL (ref >39)
LDL Calculated: 111 mg/dL — ABNORMAL HIGH (ref 0–99)
Triglycerides: 112 mg/dL (ref 0–149)
VLDL CHOLESTEROL CAL: 22 mg/dL (ref 5–40)

## 2015-09-16 DIAGNOSIS — Z1231 Encounter for screening mammogram for malignant neoplasm of breast: Secondary | ICD-10-CM | POA: Diagnosis not present

## 2015-09-17 ENCOUNTER — Other Ambulatory Visit: Payer: Self-pay | Admitting: Family

## 2015-10-17 ENCOUNTER — Other Ambulatory Visit: Payer: Self-pay | Admitting: Family Medicine

## 2015-11-17 ENCOUNTER — Other Ambulatory Visit: Payer: Self-pay | Admitting: Family Medicine

## 2015-11-24 ENCOUNTER — Telehealth: Payer: Self-pay | Admitting: Family Medicine

## 2015-11-25 NOTE — Telephone Encounter (Signed)
Scheduled

## 2015-12-09 ENCOUNTER — Encounter: Payer: Self-pay | Admitting: Family Medicine

## 2015-12-09 ENCOUNTER — Ambulatory Visit (INDEPENDENT_AMBULATORY_CARE_PROVIDER_SITE_OTHER): Payer: BLUE CROSS/BLUE SHIELD | Admitting: Family Medicine

## 2015-12-09 VITALS — BP 162/78 | HR 60 | Temp 97.6°F | Ht 63.5 in | Wt 213.0 lb

## 2015-12-09 DIAGNOSIS — K219 Gastro-esophageal reflux disease without esophagitis: Secondary | ICD-10-CM

## 2015-12-09 DIAGNOSIS — I10 Essential (primary) hypertension: Secondary | ICD-10-CM | POA: Diagnosis not present

## 2015-12-09 DIAGNOSIS — E785 Hyperlipidemia, unspecified: Secondary | ICD-10-CM

## 2015-12-09 NOTE — Progress Notes (Signed)
Subjective:    Patient ID: Carla Simmons, female    DOB: 01/15/1956, 60 y.o.   MRN: [id number]  HPI Pt here for follow up and management of chronic medical problems which includes hypertension and hyperlipidemia. She is taking medications regularly.   Blood pressure is a little elevated today at 162/60 but it generally has been okay in the past. She tells me that she drunk some V8 juice last night with lots of sodium and thinks that maybe reflected in her blood pressure. Lipids were last assessed 4 months ago and LDL is a little above goal at 111. Her husband takes methotrexate for arthritis and recently has had some elevation of his liver tests. Question of hepatitis was brought up but from what she is telling me I think it may be drug-related. If he does have some hepatitis serology she may need to be screened as well.  Patient Active Problem List   Diagnosis Date Noted  . Osteopenia 09/11/2014  . Palpitations 06/28/2013  . PVC's (premature ventricular contractions) 06/28/2013  . HTN (hypertension) 04/16/2013  . HLD (hyperlipidemia) 04/16/2013  . GERD (gastroesophageal reflux disease) 04/16/2013   Outpatient Encounter Prescriptions as of 12/09/2015  Medication Sig  . amLODipine (NORVASC) 5 MG tablet Take 1 tablet (5 mg total) by mouth daily.  Marland Kitchen. aspirin EC 81 MG tablet Take 81 mg by mouth daily.  Marland Kitchen. atenolol (TENORMIN) 50 MG tablet Take 1 tablet (50 mg total) by mouth daily.  . Ferrous Sulfate (IRON) 325 (65 FE) MG TABS Take by mouth.  . Garlic 1000 MG CAPS Take 1,000 mg by mouth daily.  . hydrochlorothiazide (HYDRODIURIL) 25 MG tablet Take 1 tablet (25 mg total) by mouth daily.  . Magnesium 250 MG TABS Take by mouth.  . meloxicam (MOBIC) 7.5 MG tablet TAKE 1 TABLET (7.5 MG TOTAL) BY MOUTH DAILY.  . Multiple Vitamins-Minerals (MULTIVITAMIN WITH MINERALS) tablet Take 1 tablet by mouth daily.  . Omega-3 Fatty Acids (FISH OIL) 1200 MG CAPS Take 1,200 mg by mouth daily.  Marland Kitchen. omeprazole  (PRILOSEC) 20 MG capsule TAKE 1 TABLET (20 MG TOTAL) BY MOUTH DAILY.  . simvastatin (ZOCOR) 20 MG tablet TAKE 1 TABLET (20 MG TOTAL) BY MOUTH DAILY.   No facility-administered encounter medications on file as of 12/09/2015.       Review of Systems  Constitutional: Negative.   HENT: Negative.   Eyes: Negative.   Respiratory: Negative.   Cardiovascular: Negative.   Gastrointestinal: Negative.   Endocrine: Negative.   Genitourinary: Negative.   Musculoskeletal: Negative.   Skin: Negative.   Allergic/Immunologic: Negative.   Neurological: Negative.   Hematological: Negative.   Psychiatric/Behavioral: Negative.        Objective:   Physical Exam  Constitutional: She is oriented to person, place, and time. She appears well-developed and well-nourished.  Cardiovascular: Normal rate, regular rhythm and normal heart sounds.   Pulmonary/Chest: Effort normal and breath sounds normal.  Neurological: She is alert and oriented to person, place, and time.  Psychiatric: She has a normal mood and affect.   BP (!) 162/78 (BP Location: Left Arm)   Pulse 60   Temp 97.6 F (36.4 C) (Oral)   Ht 5' 3.5" (1.613 m)   Wt 213 lb (96.6 kg)   BMI 37.14 kg/m         Assessment & Plan:  1. Essential hypertension Blood pressures are generally well controlled. Continue with amlodipine, atenolol, and hydrochlorothiazide  2. HLD (hyperlipidemia) Tolerates simvastatin 20 mg we'll  follow  3. Gastroesophageal reflux disease, esophagitis presence not specified Asymptomatic on Prilosec. We'll continue Carla Kuster MD

## 2015-12-09 NOTE — Patient Instructions (Signed)
Continue current medications. Continue good therapeutic lifestyle changes which include good diet and exercise. Fall precautions discussed with patient. If an FOBT was given today- please return it to our front desk. If you are over 61 years old - you may need Prevnar 13 or the adult Pneumonia vaccine.   After your visit with us today you will receive a survey in the mail or online from [employer] regarding your care with us. Please take a moment to fill this out. Your feedback is very important to us as you can help us better understand your patient needs as well as improve your experience and satisfaction. WE CARE ABOUT YOU!!!    

## 2015-12-15 ENCOUNTER — Other Ambulatory Visit: Payer: Self-pay | Admitting: Family Medicine

## 2016-01-12 DIAGNOSIS — Z23 Encounter for immunization: Secondary | ICD-10-CM | POA: Diagnosis not present

## 2016-01-23 ENCOUNTER — Other Ambulatory Visit: Payer: Self-pay | Admitting: Family Medicine

## 2016-03-02 ENCOUNTER — Other Ambulatory Visit: Payer: Self-pay | Admitting: Family Medicine

## 2016-03-12 ENCOUNTER — Other Ambulatory Visit: Payer: Self-pay | Admitting: Family Medicine

## 2016-03-12 DIAGNOSIS — I10 Essential (primary) hypertension: Secondary | ICD-10-CM

## 2016-03-17 ENCOUNTER — Other Ambulatory Visit: Payer: Self-pay | Admitting: Family Medicine

## 2016-05-21 ENCOUNTER — Other Ambulatory Visit: Payer: Self-pay | Admitting: Family Medicine

## 2016-06-06 DIAGNOSIS — Z1389 Encounter for screening for other disorder: Secondary | ICD-10-CM | POA: Diagnosis not present

## 2016-06-06 DIAGNOSIS — Z719 Counseling, unspecified: Secondary | ICD-10-CM | POA: Diagnosis not present

## 2016-06-06 DIAGNOSIS — I10 Essential (primary) hypertension: Secondary | ICD-10-CM | POA: Diagnosis not present

## 2016-06-06 DIAGNOSIS — Z008 Encounter for other general examination: Secondary | ICD-10-CM | POA: Diagnosis not present

## 2016-06-06 DIAGNOSIS — E785 Hyperlipidemia, unspecified: Secondary | ICD-10-CM | POA: Diagnosis not present

## 2016-06-18 ENCOUNTER — Other Ambulatory Visit: Payer: Self-pay | Admitting: Family Medicine

## 2016-06-18 DIAGNOSIS — I10 Essential (primary) hypertension: Secondary | ICD-10-CM

## 2016-06-19 ENCOUNTER — Other Ambulatory Visit: Payer: Self-pay | Admitting: Family Medicine

## 2016-06-20 ENCOUNTER — Ambulatory Visit (INDEPENDENT_AMBULATORY_CARE_PROVIDER_SITE_OTHER): Payer: BLUE CROSS/BLUE SHIELD | Admitting: Family Medicine

## 2016-06-20 ENCOUNTER — Telehealth: Payer: Self-pay | Admitting: Family Medicine

## 2016-06-20 ENCOUNTER — Encounter: Payer: Self-pay | Admitting: Family Medicine

## 2016-06-20 VITALS — BP 133/79 | HR 71 | Temp 97.7°F | Ht 63.5 in | Wt 232.0 lb

## 2016-06-20 DIAGNOSIS — I10 Essential (primary) hypertension: Secondary | ICD-10-CM

## 2016-06-20 DIAGNOSIS — E7800 Pure hypercholesterolemia, unspecified: Secondary | ICD-10-CM

## 2016-06-20 DIAGNOSIS — K219 Gastro-esophageal reflux disease without esophagitis: Secondary | ICD-10-CM

## 2016-06-20 MED ORDER — SIMVASTATIN 20 MG PO TABS: ORAL_TABLET | ORAL | 3 refills | Status: DC

## 2016-06-20 MED ORDER — IRON 325 (65 FE) MG PO TABS: 1.0000 | ORAL_TABLET | Freq: Every day | ORAL | 3 refills | Status: DC

## 2016-06-20 MED ORDER — OMEPRAZOLE 20 MG PO CPDR: DELAYED_RELEASE_CAPSULE | ORAL | 3 refills | Status: DC

## 2016-06-20 MED ORDER — AMLODIPINE BESYLATE 5 MG PO TABS: 5.0000 mg | ORAL_TABLET | Freq: Every day | ORAL | 3 refills | Status: DC

## 2016-06-20 MED ORDER — HYDROCHLOROTHIAZIDE 25 MG PO TABS: 25.0000 mg | ORAL_TABLET | Freq: Every day | ORAL | 3 refills | Status: DC

## 2016-06-20 MED ORDER — ATENOLOL 50 MG PO TABS: 50.0000 mg | ORAL_TABLET | Freq: Every day | ORAL | 3 refills | Status: DC

## 2016-06-20 MED ORDER — MELOXICAM 15 MG PO TABS: ORAL_TABLET | ORAL | 1 refills | Status: DC

## 2016-06-20 NOTE — Patient Instructions (Signed)
Continue current medications. Continue good therapeutic lifestyle changes which include good diet and exercise. Fall precautions discussed with patient. If an FOBT was given today- please return it to our front desk. If you are over 62 years old - you may need Prevnar 13 or the adult Pneumonia vaccine.  **Flu shots are available--- please call and schedule a FLU-CLINIC appointment**  After your visit with us today you will receive a survey in the mail or online from [employer] regarding your care with us. Please take a moment to fill this out. Your feedback is very important to us as you can help us better understand your patient needs as well as improve your experience and satisfaction. WE CARE ABOUT YOU!!!    

## 2016-06-20 NOTE — Addendum Note (Signed)
Addended by: Magdalene RiverBULLINS, Elijio Staples H on: 06/20/2016 08:31 AM   Modules accepted: Orders

## 2016-06-20 NOTE — Progress Notes (Signed)
Subjective:    Patient ID: Carla Simmons, female    DOB: 12/11/1955, 61 y.o.   MRN: [id number]  HPI Pt here for follow up and management of chronic medical problems which includes hypertension and hyperlipidemia. She is taking medication regularly. Patient continues to work nights blood pressures and lipids have been well controlled in the past. On some blood work done at work her sugar was said to be elevated although she questions the diagnosis of diabetes. She requests that we repeat the blood work and tell her really where she stands as far as that. She does have a history of hypertension and hyperlipidemia so would not be surprising if she has diabetes. Addition she is overweight.    Patient Active Problem List   Diagnosis Date Noted  . Osteopenia 09/11/2014  . Palpitations 06/28/2013  . PVC's (premature ventricular contractions) 06/28/2013  . HTN (hypertension) 04/16/2013  . HLD (hyperlipidemia) 04/16/2013  . GERD (gastroesophageal reflux disease) 04/16/2013   Outpatient Encounter Prescriptions as of 06/20/2016  Medication Sig  . amLODipine (NORVASC) 5 MG tablet TAKE 1 TABLET (5 MG TOTAL) BY MOUTH DAILY.  Marland Kitchen aspirin EC 81 MG tablet Take 81 mg by mouth daily.  Marland Kitchen atenolol (TENORMIN) 50 MG tablet TAKE 1 TABLET (50 MG TOTAL) BY MOUTH DAILY.  Marland Kitchen Ferrous Sulfate (IRON) 325 (65 FE) MG TABS Take by mouth.  . Garlic 5465 MG CAPS Take 1,000 mg by mouth daily.  . hydrochlorothiazide (HYDRODIURIL) 25 MG tablet TAKE 1 TABLET (25 MG TOTAL) BY MOUTH DAILY.  . Magnesium 250 MG TABS Take by mouth.  . meloxicam (MOBIC) 15 MG tablet TAKE 1 TABLET (15 MG TOTAL) BY MOUTH DAILY.  . meloxicam (MOBIC) 7.5 MG tablet TAKE 1 TABLET (7.5 MG TOTAL) BY MOUTH DAILY.  . Multiple Vitamins-Minerals (MULTIVITAMIN WITH MINERALS) tablet Take 1 tablet by mouth daily.  . Omega-3 Fatty Acids (FISH OIL) 1200 MG CAPS Take 1,200 mg by mouth daily.  Marland Kitchen omeprazole (PRILOSEC) 20 MG capsule TAKE 1 TABLET (20 MG TOTAL) BY  MOUTH DAILY.  . simvastatin (ZOCOR) 20 MG tablet TAKE 1 TABLET (20 MG TOTAL) BY MOUTH DAILY.   No facility-administered encounter medications on file as of 06/20/2016.       Review of Systems  Constitutional: Negative.   HENT: Negative.   Eyes: Negative.   Respiratory: Negative.   Cardiovascular: Negative.   Gastrointestinal: Negative.   Endocrine: Negative.   Genitourinary: Negative.   Musculoskeletal: Negative.   Skin: Negative.   Allergic/Immunologic: Negative.   Neurological: Negative.   Hematological: Negative.   Psychiatric/Behavioral: Negative.        Objective:   Physical Exam  Constitutional: She is oriented to person, place, and time. She appears well-developed and well-nourished.  Cardiovascular: Normal rate, regular rhythm and normal heart sounds.   Pulmonary/Chest: Effort normal and breath sounds normal.  Neurological: She is alert and oriented to person, place, and time.  Psychiatric: She has a normal mood and affect. Her behavior is normal.   BP 133/79 (BP Location: Left Arm)   Pulse 71   Temp 97.7 F (36.5 C) (Oral)   Ht 5' 3.5" (1.613 m)   Wt 232 lb (105.2 kg)   BMI 40.45 kg/m         Assessment & Plan:  1. Essential hypertension Blood pressure looks good. Continue on triple drug regimen - CMP14+EGFR  2. Pure hypercholesterolemia Last labs where not at target but close - Lipid panel  3. Gastroesophageal reflux disease, esophagitis  presence not specified Symptoms controlled with Prilosec  Wardell Honour MD

## 2016-06-21 LAB — CMP14+EGFR
ALK PHOS: 92 IU/L (ref 39–117)
ALT: 16 IU/L (ref 0–32)
AST: 22 IU/L (ref 0–40)
Albumin/Globulin Ratio: 1.4 (ref 1.2–2.2)
Albumin: 4.1 g/dL (ref 3.6–4.8)
BUN/Creatinine Ratio: 39 — ABNORMAL HIGH (ref 12–28)
BUN: 25 mg/dL (ref 8–27)
Bilirubin Total: 0.4 mg/dL (ref 0.0–1.2)
CO2: 28 mmol/L (ref 18–29)
Calcium: 9.3 mg/dL (ref 8.7–10.3)
Chloride: 98 mmol/L (ref 96–106)
Creatinine, Ser: 0.64 mg/dL (ref 0.57–1.00)
GFR calc Af Amer: 111 mL/min/{1.73_m2} (ref >59)
GFR calc non Af Amer: 97 mL/min/{1.73_m2} (ref >59)
GLOBULIN, TOTAL: 2.9 g/dL (ref 1.5–4.5)
Glucose: 96 mg/dL (ref 65–99)
POTASSIUM: 3.9 mmol/L (ref 3.5–5.2)
SODIUM: 142 mmol/L (ref 134–144)
Total Protein: 7 g/dL (ref 6.0–8.5)

## 2016-06-21 LAB — LIPID PANEL
CHOL/HDL RATIO: 3.5 ratio (ref 0.0–4.4)
CHOLESTEROL TOTAL: 179 mg/dL (ref 100–199)
HDL: 51 mg/dL (ref >39)
LDL Calculated: 110 mg/dL — ABNORMAL HIGH (ref 0–99)
TRIGLYCERIDES: 91 mg/dL (ref 0–149)
VLDL Cholesterol Cal: 18 mg/dL (ref 5–40)

## 2016-06-21 NOTE — Telephone Encounter (Signed)
Mailed labs to pt

## 2016-08-01 DIAGNOSIS — E785 Hyperlipidemia, unspecified: Secondary | ICD-10-CM | POA: Diagnosis not present

## 2016-08-01 DIAGNOSIS — I10 Essential (primary) hypertension: Secondary | ICD-10-CM | POA: Diagnosis not present

## 2016-08-01 DIAGNOSIS — Z719 Counseling, unspecified: Secondary | ICD-10-CM | POA: Diagnosis not present

## 2016-08-01 DIAGNOSIS — Z008 Encounter for other general examination: Secondary | ICD-10-CM | POA: Diagnosis not present

## 2016-08-01 DIAGNOSIS — Z1389 Encounter for screening for other disorder: Secondary | ICD-10-CM | POA: Diagnosis not present

## 2016-08-15 ENCOUNTER — Other Ambulatory Visit: Payer: Self-pay | Admitting: Family Medicine

## 2016-09-12 ENCOUNTER — Other Ambulatory Visit: Payer: Self-pay | Admitting: Family Medicine

## 2016-09-15 DIAGNOSIS — Z1231 Encounter for screening mammogram for malignant neoplasm of breast: Secondary | ICD-10-CM | POA: Diagnosis not present

## 2016-10-06 ENCOUNTER — Encounter: Payer: Self-pay | Admitting: Pediatrics

## 2016-10-06 ENCOUNTER — Ambulatory Visit (INDEPENDENT_AMBULATORY_CARE_PROVIDER_SITE_OTHER): Payer: BLUE CROSS/BLUE SHIELD | Admitting: Pediatrics

## 2016-10-06 VITALS — BP 134/75 | HR 60 | Temp 97.7°F | Ht 63.5 in | Wt 232.6 lb

## 2016-10-06 DIAGNOSIS — J069 Acute upper respiratory infection, unspecified: Secondary | ICD-10-CM | POA: Diagnosis not present

## 2016-10-06 DIAGNOSIS — R42 Dizziness and giddiness: Secondary | ICD-10-CM

## 2016-10-06 DIAGNOSIS — H6593 Unspecified nonsuppurative otitis media, bilateral: Secondary | ICD-10-CM | POA: Diagnosis not present

## 2016-10-06 DIAGNOSIS — H8302 Labyrinthitis, left ear: Secondary | ICD-10-CM

## 2016-10-06 MED ORDER — CETIRIZINE HCL 10 MG PO TABS: 10.0000 mg | ORAL_TABLET | Freq: Every day | ORAL | 11 refills | Status: DC

## 2016-10-06 MED ORDER — FLUTICASONE PROPIONATE 50 MCG/ACT NA SUSP: 2.0000 | Freq: Every day | NASAL | 6 refills | Status: DC

## 2016-10-06 MED ORDER — MECLIZINE HCL 12.5 MG PO TABS: 12.5000 mg | ORAL_TABLET | Freq: Three times a day (TID) | ORAL | 0 refills | Status: DC | PRN

## 2016-10-06 NOTE — Patient Instructions (Addendum)
Take flonase two sprays daily Cetirizine or similar antihistamine daily  How to Perform the Epley Maneuver The Epley maneuver is an exercise that relieves symptoms of vertigo. Vertigo is the feeling that you or your surroundings are moving when they are not. When you feel vertigo, you may feel like the room is spinning and have trouble walking. Dizziness is a little different than vertigo. When you are dizzy, you may feel unsteady or light-headed. You can do this maneuver at home whenever you have symptoms of vertigo. You can do it up to 3 times a day until your symptoms go away. Even though the Epley maneuver may relieve your vertigo for a few weeks, it is possible that your symptoms will return. This maneuver relieves vertigo, but it does not relieve dizziness. What are the risks? If it is done correctly, the Epley maneuver is considered safe. Sometimes it can lead to dizziness or nausea that goes away after a short time. If you develop other symptoms, such as changes in vision, weakness, or numbness, stop doing the maneuver and call your health care provider. How to perform the Epley maneuver 1. Sit on the edge of a bed or table with your back straight and your legs extended or hanging over the edge of the bed or table. 2. Turn your head halfway toward the affected ear or side. 3. Lie backward quickly with your head turned until you are lying flat on your back. You may want to position a pillow under your shoulders. 4. Hold this position for 30 seconds. You may experience an attack of vertigo. This is normal. 5. Turn your head to the opposite direction until your unaffected ear is facing the floor. 6. Hold this position for 30 seconds. You may experience an attack of vertigo. This is normal. Hold this position until the vertigo stops. 7. Turn your whole body to the same side as your head. Hold for another 30 seconds. 8. Sit back up. You can repeat this exercise up to 3 times a day. Follow these  instructions at home:  After doing the Epley maneuver, you can return to your normal activities.  Ask your health care provider if there is anything you should do at home to prevent vertigo. He or she may recommend that you: ? Keep your head raised (elevated) with two or more pillows while you sleep. ? Do not sleep on the side of your affected ear. ? Get up slowly from bed. ? Avoid sudden movements during the day. ? Avoid extreme head movement, like looking up or bending over. Contact a health care provider if:  Your vertigo gets worse.  You have other symptoms, including: ? Nausea. ? Vomiting. ? Headache. Get help right away if:  You have vision changes.  You have a severe or worsening headache or neck pain.  You cannot stop vomiting.  You have new numbness or weakness in any part of your body. Summary  Vertigo is the feeling that you or your surroundings are moving when they are not.  The Epley maneuver is an exercise that relieves symptoms of vertigo.  If the Epley maneuver is done correctly, it is considered safe. You can do it up to 3 times a day. This information is not intended to replace advice given to you by your health care provider. Make sure you discuss any questions you have with your health care provider. Document Released: 03/26/2013 Document Revised: 02/09/2016 Document Reviewed: 02/09/2016 Elsevier Interactive Patient Education  2017 [employer]

## 2016-10-06 NOTE — Progress Notes (Signed)
  Subjective:   Patient ID: Carla Simmons, female    DOB: 12/09/1955, 61 y.o.   MRN: [id number] CC: Dizziness and Ear Pain  HPI: Carla BeetsJoyce Mary Stahl is a 61 y.o. female presenting for Dizziness and Ear Pain  Woke up in the evening 2 nights ago to go to work and felt dizzy If she turns her head or bends over since then feels "like shes on a boat" Has been getitng motion sickness when she moves As long as she doesn't move she doesn't have symptoms No fevers Had sore throat two days ago, better now No coughing Normal appetite, sometimes will feel nauseous with vertigo  Feels something in her left ear, like if she stuck a qtip too far but doesn't use qtips  Has had some runny nose and congestion past few days Feeling much more tired than usual past 1-2 days  Relevant past medical, surgical, family and social history reviewed. Allergies and medications reviewed and updated. History  Smoking Status  . Former Smoker  . Types: Cigarettes  . Quit date: 12/22/2015  Smokeless Tobacco  . Never Used   ROS: Per HPI   Objective:    BP 134/75   Pulse 60   Temp 97.7 F (36.5 C) (Oral)   Ht 5' 3.5" (1.613 m)   Wt 232 lb 9.6 oz (105.5 kg)   BMI 40.56 kg/m   Wt Readings from Last 3 Encounters:  10/06/16 232 lb 9.6 oz (105.5 kg)  06/20/16 232 lb (105.2 kg)  12/09/15 213 lb (96.6 kg)    Gen: NAD, alert, cooperative with exam, NCAT EYES: EOMI, no conjunctival injection, or no icterus, 1-2 beats of nystagmus with L ward gaze ENT:  TM gray with white effusion b/l, OP with mild erythema LYMPH: no cervical LAD CV: NRRR, normal S1/S2, no murmur, distal pulses 2+ b/l Resp: CTABL, no wheezes, normal WOB Abd: +BS, soft, NTND. no guarding or organomegaly Ext: No edema, warm Neuro: Alert and oriented, strength equal b/l UE and LE, coordination grossly normal  Assessment & Plan:  Alona BeneJoyce was seen today for dizziness and ear pain.  Diagnoses and all orders for this visit:  Labyrinthitis of left  ear -     meclizine (ANTIVERT) 12.5 MG tablet; Take 1 tablet (12.5 mg total) by mouth 3 (three) times daily as needed for dizziness.  Vertigo Meclizine as needed, treat middle ear effusion, can try epley maneuver  Fluid level behind tympanic membrane of both ears Treat with below, if not improving let me know -     fluticasone (FLONASE) 50 MCG/ACT nasal spray; Place 2 sprays into both nostrils daily. -     cetirizine (ZYRTEC) 10 MG tablet; Take 1 tablet (10 mg total) by mouth daily.  Acute URI Discussed symptom care, return precautions  Follow up plan: Return in about 3 months (around 12/29/2016). for CPE Rex Krasarol Jamari Diana, MD [facility]

## 2016-10-12 ENCOUNTER — Other Ambulatory Visit: Payer: Self-pay | Admitting: Family Medicine

## 2016-10-17 ENCOUNTER — Encounter: Payer: Self-pay | Admitting: *Deleted

## 2016-11-02 DIAGNOSIS — Z008 Encounter for other general examination: Secondary | ICD-10-CM | POA: Diagnosis not present

## 2016-11-02 DIAGNOSIS — Z719 Counseling, unspecified: Secondary | ICD-10-CM | POA: Diagnosis not present

## 2016-11-02 DIAGNOSIS — I10 Essential (primary) hypertension: Secondary | ICD-10-CM | POA: Diagnosis not present

## 2016-11-02 DIAGNOSIS — E785 Hyperlipidemia, unspecified: Secondary | ICD-10-CM | POA: Diagnosis not present

## 2016-11-25 ENCOUNTER — Other Ambulatory Visit: Payer: Self-pay | Admitting: Family Medicine

## 2016-12-21 ENCOUNTER — Ambulatory Visit: Payer: BLUE CROSS/BLUE SHIELD | Admitting: Pediatrics

## 2016-12-22 ENCOUNTER — Encounter: Payer: Self-pay | Admitting: Pediatrics

## 2016-12-22 ENCOUNTER — Ambulatory Visit (INDEPENDENT_AMBULATORY_CARE_PROVIDER_SITE_OTHER): Payer: BLUE CROSS/BLUE SHIELD | Admitting: Pediatrics

## 2016-12-22 VITALS — BP 129/77 | HR 64 | Temp 98.1°F | Ht 63.5 in | Wt 235.0 lb

## 2016-12-22 DIAGNOSIS — H8302 Labyrinthitis, left ear: Secondary | ICD-10-CM

## 2016-12-22 DIAGNOSIS — E785 Hyperlipidemia, unspecified: Secondary | ICD-10-CM

## 2016-12-22 DIAGNOSIS — R739 Hyperglycemia, unspecified: Secondary | ICD-10-CM | POA: Diagnosis not present

## 2016-12-22 DIAGNOSIS — M1711 Unilateral primary osteoarthritis, right knee: Secondary | ICD-10-CM | POA: Diagnosis not present

## 2016-12-22 DIAGNOSIS — I10 Essential (primary) hypertension: Secondary | ICD-10-CM

## 2016-12-22 DIAGNOSIS — R7303 Prediabetes: Secondary | ICD-10-CM | POA: Diagnosis not present

## 2016-12-22 DIAGNOSIS — Z1211 Encounter for screening for malignant neoplasm of colon: Secondary | ICD-10-CM

## 2016-12-22 LAB — BAYER DCA HB A1C WAIVED: HB A1C: 5.8 % (ref <7.0)

## 2016-12-22 MED ORDER — AMLODIPINE BESYLATE 5 MG PO TABS: 5.0000 mg | ORAL_TABLET | Freq: Every day | ORAL | 3 refills | Status: DC

## 2016-12-22 MED ORDER — ATENOLOL 50 MG PO TABS: 50.0000 mg | ORAL_TABLET | Freq: Every day | ORAL | 3 refills | Status: DC

## 2016-12-22 MED ORDER — MELOXICAM 15 MG PO TABS: 15.0000 mg | ORAL_TABLET | Freq: Every day | ORAL | 1 refills | Status: DC

## 2016-12-22 MED ORDER — MECLIZINE HCL 12.5 MG PO TABS: 12.5000 mg | ORAL_TABLET | Freq: Three times a day (TID) | ORAL | 0 refills | Status: DC | PRN

## 2016-12-22 MED ORDER — SIMVASTATIN 20 MG PO TABS: ORAL_TABLET | ORAL | 3 refills | Status: DC

## 2016-12-22 NOTE — Progress Notes (Signed)
  Subjective:   Patient ID: Carla Simmons, female    DOB: April 26, 1955, 62 y.o.   MRN: [id number] CC: Follow-up (6 month)  HPI: Carla Simmons is a 61 y.o. female presenting for Follow-up (6 month)  Feeling well overall Has some arthritis that continues to bother her in R knee meloxicam helps  HTN: no CP, no SOB Taking meds regularly  HLD: takes med regularly, no s/e  Elevated BMI: walking is hard because of knee Eating sugary foods off and on  Sometimes has vertigo, there some days, not others Meclizine helps Doesn't limit her activities  Relevant past medical, surgical, family and social history reviewed. Allergies and medications reviewed and updated. History  Smoking Status  . Former Smoker  . Types: Cigarettes  . Quit date: 12/22/2015  Smokeless Tobacco  . Never Used   ROS: Per HPI   Objective:    BP 129/77   Pulse 64   Temp 98.1 F (36.7 C) (Oral)   Ht 5' 3.5" (1.613 m)   Wt 235 lb (106.6 kg)   BMI 40.98 kg/m   Wt Readings from Last 3 Encounters:  12/22/16 235 lb (106.6 kg)  10/06/16 232 lb 9.6 oz (105.5 kg)  06/20/16 232 lb (105.2 kg)    Gen: NAD, alert, cooperative with exam, NCAT EYES: EOMI, no conjunctival injection, or no icterus ENT:  TMs pearly gray b/l, OP without erythema LYMPH: no cervical LAD CV: NRRR, normal S1/S2 Resp: CTABL, no wheezes, normal WOB Abd: +BS, soft, NTND. Ext: No edema, warm Neuro: Alert and oriente  Assessment & Plan:  Carla Simmons was seen today for follow-up.  Diagnoses and all orders for this visit:  Essential hypertension Adequate control, cont current meds -     amLODipine (NORVASC) 5 MG tablet; Take 1 tablet (5 mg total) by mouth daily. -     atenolol (TENORMIN) 50 MG tablet; Take 1 tablet (50 mg total) by mouth daily. -     BMP8+EGFR  Labyrinthitis of left ear Cont below prn -     meclizine (ANTIVERT) 12.5 MG tablet; Take 1 tablet (12.5 mg total) by mouth 3 (three) times daily as needed for dizziness.  Screen  for colon cancer -     Ambulatory referral to Gastroenterology  Hyperglycemia -     Bayer DCA Hb A1c Waived  Hyperlipidemia, unspecified hyperlipidemia type Stable, cont below -     simvastatin (ZOCOR) 20 MG tablet; TAKE 1 TABLET (20 MG TOTAL) BY MOUTH DAILY.  Osteoarthritis of right knee, unspecified osteoarthritis type Stable, cont below -     meloxicam (MOBIC) 15 MG tablet; Take 1 tablet (15 mg total) by mouth daily.  Severe obesity (BMI >= 40) (HCC) Wt loss strategies discussed  Pre-diabetes Encourage lifestyle changes, crease sugary food intake  Follow up plan: Return in about 6 months (around 06/21/2017). Assunta Found, MD [place]

## 2016-12-22 NOTE — Patient Instructions (Signed)
Try to cut back on sodas so you aren't drinking one most days

## 2016-12-23 ENCOUNTER — Encounter: Payer: Self-pay | Admitting: Pediatrics

## 2016-12-23 DIAGNOSIS — R7303 Prediabetes: Secondary | ICD-10-CM | POA: Insufficient documentation

## 2016-12-23 LAB — BMP8+EGFR
BUN / CREAT RATIO: 33 — AB (ref 12–28)
BUN: 22 mg/dL (ref 8–27)
CALCIUM: 9.3 mg/dL (ref 8.7–10.3)
CHLORIDE: 101 mmol/L (ref 96–106)
CO2: 23 mmol/L (ref 20–29)
CREATININE: 0.66 mg/dL (ref 0.57–1.00)
GFR calc Af Amer: 109 mL/min/{1.73_m2} (ref >59)
GFR calc non Af Amer: 95 mL/min/{1.73_m2} (ref >59)
Glucose: 100 mg/dL — ABNORMAL HIGH (ref 65–99)
Potassium: 4.1 mmol/L (ref 3.5–5.2)
Sodium: 140 mmol/L (ref 134–144)

## 2016-12-29 ENCOUNTER — Encounter: Payer: Self-pay | Admitting: *Deleted

## 2017-02-27 DIAGNOSIS — Z008 Encounter for other general examination: Secondary | ICD-10-CM | POA: Diagnosis not present

## 2017-02-27 DIAGNOSIS — I10 Essential (primary) hypertension: Secondary | ICD-10-CM | POA: Diagnosis not present

## 2017-02-27 DIAGNOSIS — Z719 Counseling, unspecified: Secondary | ICD-10-CM | POA: Diagnosis not present

## 2017-02-27 DIAGNOSIS — Z7189 Other specified counseling: Secondary | ICD-10-CM | POA: Diagnosis not present

## 2017-02-27 DIAGNOSIS — E785 Hyperlipidemia, unspecified: Secondary | ICD-10-CM | POA: Diagnosis not present

## 2017-04-30 ENCOUNTER — Other Ambulatory Visit: Payer: Self-pay | Admitting: Pediatrics

## 2017-04-30 DIAGNOSIS — M1711 Unilateral primary osteoarthritis, right knee: Secondary | ICD-10-CM

## 2017-05-01 NOTE — Telephone Encounter (Signed)
Last seen 12/22/16  Dr VSeth Bake

## 2017-06-14 DIAGNOSIS — Z719 Counseling, unspecified: Secondary | ICD-10-CM | POA: Diagnosis not present

## 2017-06-14 DIAGNOSIS — Z1389 Encounter for screening for other disorder: Secondary | ICD-10-CM | POA: Diagnosis not present

## 2017-06-14 DIAGNOSIS — Z008 Encounter for other general examination: Secondary | ICD-10-CM | POA: Diagnosis not present

## 2017-06-14 DIAGNOSIS — E785 Hyperlipidemia, unspecified: Secondary | ICD-10-CM | POA: Diagnosis not present

## 2017-06-14 DIAGNOSIS — I10 Essential (primary) hypertension: Secondary | ICD-10-CM | POA: Diagnosis not present

## 2017-06-21 ENCOUNTER — Encounter: Payer: Self-pay | Admitting: Pediatrics

## 2017-06-21 ENCOUNTER — Encounter (INDEPENDENT_AMBULATORY_CARE_PROVIDER_SITE_OTHER): Payer: Self-pay

## 2017-06-21 ENCOUNTER — Ambulatory Visit: Payer: BLUE CROSS/BLUE SHIELD | Admitting: Pediatrics

## 2017-06-21 DIAGNOSIS — R7303 Prediabetes: Secondary | ICD-10-CM | POA: Diagnosis not present

## 2017-06-21 DIAGNOSIS — K219 Gastro-esophageal reflux disease without esophagitis: Secondary | ICD-10-CM | POA: Diagnosis not present

## 2017-06-21 DIAGNOSIS — I10 Essential (primary) hypertension: Secondary | ICD-10-CM | POA: Diagnosis not present

## 2017-06-21 DIAGNOSIS — M1711 Unilateral primary osteoarthritis, right knee: Secondary | ICD-10-CM

## 2017-06-21 DIAGNOSIS — M25511 Pain in right shoulder: Secondary | ICD-10-CM | POA: Diagnosis not present

## 2017-06-21 DIAGNOSIS — Z1211 Encounter for screening for malignant neoplasm of colon: Secondary | ICD-10-CM

## 2017-06-21 MED ORDER — OMEPRAZOLE 20 MG PO CPDR: DELAYED_RELEASE_CAPSULE | ORAL | 3 refills | Status: DC

## 2017-06-21 MED ORDER — HYDROCHLOROTHIAZIDE 25 MG PO TABS: 25.0000 mg | ORAL_TABLET | Freq: Every day | ORAL | 3 refills | Status: DC

## 2017-06-21 MED ORDER — IRON 325 (65 FE) MG PO TABS: 1.0000 | ORAL_TABLET | Freq: Every day | ORAL | 0 refills | Status: AC

## 2017-06-21 MED ORDER — MELOXICAM 15 MG PO TABS: 15.0000 mg | ORAL_TABLET | Freq: Every day | ORAL | 2 refills | Status: DC

## 2017-06-21 NOTE — Progress Notes (Signed)
Subjective:   Patient ID: Carla Simmons, female    DOB: 08/28/1955, 62 y.o.   MRN: [id number] CC: Follow-up (6 month) and Arm Pain (Bilateral, When waking up in morning)  HPI: Carla Simmons is a 62 y.o. female presenting for Follow-up (6 month) and Arm Pain (Bilateral, When waking up in morning)  HTN: No shortness of breath, no chest pain.  Taking medicine regularly.  Follows blood pressure work as well.  Prediabetes: Has a nutritionist available at work, has not yet started seeing them.  Sees the nurse at work every 3 months.  She has been encouraging her to lose weight as well.  Also encouraging her to avoid sugary foods.  Is trying to stop regular sodas.  Arthritis: Taking meloxicam once a day in the morning.  He does not bothers much when she does this regularly.  No family history of colon cancer.  Asks about the [employer] guard instead of doing a colonoscopy.  Elevated BMI: Not regularly exercising outside of work, does stay active.  Is remodeling her house right now.  Right shoulder has been bothering her in the morning when she wakes up.  Feels stiff.  Sometimes notes that when she rolls over in the morning as well.  The stiffness lasts for about an hour.  Then is able to use arm normally.  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  Lifting up to 7-10 pounds of weight over her head.  No trouble with around then.  Pain is worse when the shoulder is not supported when she is sleeping at night.  Using a more firm mattress is also helped her pain.  Relevant past medical, surgical, family and social history reviewed. Allergies and medications reviewed and updated. Social History   Tobacco Use  Smoking Status Former Smoker  . Types: Cigarettes  . Last attempt to quit: 12/22/2015  . Years since quitting: 1.4  Smokeless Tobacco Never Used   ROS: Per HPI   Objective:    BP 139/81   Pulse 61   Temp (!) 96.7 F (35.9 C) (Oral)   Ht 5' 3.5" (1.613 m)   Wt 240 lb (108.9 kg)   BMI  41.85 kg/m   Wt Readings from Last 3 Encounters:  06/21/17 240 lb (108.9 kg)  12/22/16 235 lb (106.6 kg)  10/06/16 232 lb 9.6 oz (105.5 kg)    Gen: NAD, alert, cooperative with exam, NCAT EYES: EOMI, no conjunctival injection, or no icterus CV: NRRR, normal S1/S2, no murmur, distal pulses 2+ b/l Resp: CTABL, no wheezes, normal WOB Abd: +BS, soft, NTND. no guarding or organomegaly Ext: No edema, warm Neuro: Alert and oriented, strength equal b/l UE and LE, coordination grossly normal MSK: Rotator cuff muscles intact bilaterally strength equal bilateral flexion and extension at the elbow, abduction, adduction of the shoulders b/l.  No point tenderness over AC joint, biceps groove right shoulder.  Assessment & Plan:  Carla Simmons was seen today for follow-up and arm pain.  Diagnoses and all orders for this visit:  Severe obesity (BMI >= 40) (HCC) Pre-diabetes Encouraged patient to meet with a nutritionist at work.  Continue lifestyle changes, avoiding sugar, increasing physical activity.  Essential hypertension Slightly elevated today, continue current medicines.  Continue to follow blood pressures at work.  Irregularly elevated let me know. -     hydrochlorothiazide (HYDRODIURIL) 25 MG tablet; Take 1 tablet (25 mg total) by mouth daily.  Osteoarthritis of right knee, unspecified osteoarthritis type Stable, continue below. -  meloxicam (MOBIC) 15 MG tablet; Take 1 tablet (15 mg total) by mouth daily.  Gastroesophageal reflux disease, esophagitis presence not specified Stable, continue below. -     omeprazole (PRILOSEC) 20 MG capsule; TAKE 1 TABLET (20 MG TOTAL) BY MOUTH DAILY.  Encounter for screening colonoscopy -     Cologuard  Acute pain of right shoulder No pain now, rotator cuff muscles intact.  Any worsening in the pain will let me know.  Other orders Gives blood and takes an iron pill once a week for that. -     Ferrous Sulfate (IRON) 325 (65 Fe) MG TABS; Take 1 tablet  (325 mg total) by mouth daily.   Follow up plan: Return in about 3 months (around 09/21/2017). Rex Kras, MD [facility]

## 2017-09-06 DIAGNOSIS — I10 Essential (primary) hypertension: Secondary | ICD-10-CM | POA: Diagnosis not present

## 2017-09-06 DIAGNOSIS — E785 Hyperlipidemia, unspecified: Secondary | ICD-10-CM | POA: Diagnosis not present

## 2017-09-06 DIAGNOSIS — Z719 Counseling, unspecified: Secondary | ICD-10-CM | POA: Diagnosis not present

## 2017-09-06 DIAGNOSIS — Z008 Encounter for other general examination: Secondary | ICD-10-CM | POA: Diagnosis not present

## 2017-09-14 DIAGNOSIS — Z1231 Encounter for screening mammogram for malignant neoplasm of breast: Secondary | ICD-10-CM | POA: Diagnosis not present

## 2017-09-14 LAB — HM MAMMOGRAPHY

## 2017-09-21 ENCOUNTER — Ambulatory Visit: Payer: BLUE CROSS/BLUE SHIELD | Admitting: Pediatrics

## 2017-09-21 ENCOUNTER — Encounter: Payer: Self-pay | Admitting: Pediatrics

## 2017-09-21 VITALS — BP 126/68 | HR 60 | Temp 98.0°F | Ht 63.5 in | Wt 239.0 lb

## 2017-09-21 DIAGNOSIS — K219 Gastro-esophageal reflux disease without esophagitis: Secondary | ICD-10-CM

## 2017-09-21 DIAGNOSIS — R7303 Prediabetes: Secondary | ICD-10-CM

## 2017-09-21 DIAGNOSIS — Z6841 Body Mass Index (BMI) 40.0 and over, adult: Secondary | ICD-10-CM | POA: Diagnosis not present

## 2017-09-21 DIAGNOSIS — I10 Essential (primary) hypertension: Secondary | ICD-10-CM | POA: Diagnosis not present

## 2017-09-21 DIAGNOSIS — E7800 Pure hypercholesterolemia, unspecified: Secondary | ICD-10-CM | POA: Diagnosis not present

## 2017-09-21 LAB — BAYER DCA HB A1C WAIVED: HB A1C (BAYER DCA - WAIVED): 6 % (ref <7.0)

## 2017-09-21 NOTE — Progress Notes (Signed)
  Subjective:   Patient ID: Carla Simmons, female    DOB: 28-Apr-1955, 62 y.o.   MRN: [id number] CC: Medical Management of Chronic Issues  HPI: Carla Simmons is a 62 y.o. female   Hyperlipidemia: Tolerating statin well, no side effects  Hypertension: Taking her medicines regularly.  No lightheadedness or dizziness.  No chest pressure pain with exertion.  Elevated BMI: Fairly active at work.  Trying to avoid sugary foods, working on portion control.  GERD: Taking PPI regularly, Open to trying to wean.  Relevant past medical, surgical, family and social history reviewed. Allergies and medications reviewed and updated. Social History   Tobacco Use  Smoking Status Former Smoker  . Types: Cigarettes  . Last attempt to quit: 12/22/2015  . Years since quitting: 1.7  Smokeless Tobacco Never Used   ROS: Per HPI   Objective:    BP 126/68   Pulse 60   Temp 98 F (36.7 C) (Oral)   Ht 5' 3.5" (1.613 m)   Wt 239 lb (108.4 kg)   BMI 41.67 kg/m   Wt Readings from Last 3 Encounters:  09/21/17 239 lb (108.4 kg)  06/21/17 240 lb (108.9 kg)  12/22/16 235 lb (106.6 kg)    Gen: NAD, alert, cooperative with exam, NCAT EYES: EOMI, no conjunctival injection, or no icterus ENT:  TMs pearly gray b/l, OP without erythema LYMPH: no cervical LAD CV: NRRR, normal S1/S2, no murmur, distal pulses 2+ b/l Resp: CTABL, no wheezes, normal WOB Abd: +BS, soft, NTND. no guarding or organomegaly Ext: No pitting edema, warm Neuro: Alert and oriented, strength equal b/l UE and LE, coordination grossly normal MSK: normal muscle bulk  Assessment & Plan:  Carla Simmons was seen today for medical management of chronic issues.  Diagnoses and all orders for this visit:  Pure hypercholesterolemia Continue statin -     Lipid panel  Essential hypertension Stable, continue below -     BMP8+EGFR  Gastroesophageal reflux disease, esophagitis presence not specified Stable, continue PPI.  Discussed weaning,  taking every other day as able.  BMI 40.0-44.9, adult ([place]) Pre-diabetes Continue lifestyle changes, counseled today on nutrition, physical activity. -     Bayer DCA Hb A1c Waived  Due for colonoscopy, patient aware.  She will call to set up appointment.  Follow up plan: Return in about 4 months (around 01/21/2018) for well exam. Carla Found, MD [place]

## 2017-09-21 NOTE — Patient Instructions (Signed)
Call to set up appt with GI for colonoscopy

## 2017-09-22 LAB — BMP8+EGFR
BUN / CREAT RATIO: 20 (ref 12–28)
BUN: 13 mg/dL (ref 8–27)
CALCIUM: 9.6 mg/dL (ref 8.7–10.3)
CHLORIDE: 101 mmol/L (ref 96–106)
CO2: 28 mmol/L (ref 20–29)
CREATININE: 0.66 mg/dL (ref 0.57–1.00)
GFR calc non Af Amer: 95 mL/min/{1.73_m2} (ref >59)
GFR, EST AFRICAN AMERICAN: 109 mL/min/{1.73_m2} (ref >59)
Glucose: 111 mg/dL — ABNORMAL HIGH (ref 65–99)
Potassium: 4.1 mmol/L (ref 3.5–5.2)
Sodium: 143 mmol/L (ref 134–144)

## 2017-09-22 LAB — LIPID PANEL
Chol/HDL Ratio: 3.6 ratio (ref 0.0–4.4)
Cholesterol, Total: 166 mg/dL (ref 100–199)
HDL: 46 mg/dL (ref >39)
LDL CALC: 101 mg/dL — AB (ref 0–99)
Triglycerides: 96 mg/dL (ref 0–149)
VLDL CHOLESTEROL CAL: 19 mg/dL (ref 5–40)

## 2017-09-28 ENCOUNTER — Encounter: Payer: Self-pay | Admitting: *Deleted

## 2017-10-10 ENCOUNTER — Other Ambulatory Visit: Payer: Self-pay

## 2017-10-10 DIAGNOSIS — Z1211 Encounter for screening for malignant neoplasm of colon: Secondary | ICD-10-CM

## 2017-11-07 NOTE — Progress Notes (Signed)
In Care Everywhere Cat 1 

## 2017-11-15 ENCOUNTER — Ambulatory Visit (INDEPENDENT_AMBULATORY_CARE_PROVIDER_SITE_OTHER): Payer: Self-pay

## 2017-11-15 DIAGNOSIS — I10 Essential (primary) hypertension: Secondary | ICD-10-CM | POA: Diagnosis not present

## 2017-11-15 DIAGNOSIS — E785 Hyperlipidemia, unspecified: Secondary | ICD-10-CM | POA: Diagnosis not present

## 2017-11-15 DIAGNOSIS — Z008 Encounter for other general examination: Secondary | ICD-10-CM | POA: Diagnosis not present

## 2017-11-15 DIAGNOSIS — Z1211 Encounter for screening for malignant neoplasm of colon: Secondary | ICD-10-CM

## 2017-11-15 MED ORDER — NA SULFATE-K SULFATE-MG SULF 17.5-3.13-1.6 GM/177ML PO SOLN: 1.0000 | ORAL | 0 refills | Status: DC

## 2017-11-15 NOTE — Patient Instructions (Signed)
Concettina Leth  Jan 12, 1956 MRN: [id number]     Procedure Date: 01/22/18 Time to register: 7:30am Place to register: [facility] Procedure Time: 8:30am Scheduled provider: Barney Drain, MD    PREPARATION FOR COLONOSCOPY WITH SUPREP BOWEL PREP KIT  Note: Suprep Bowel Prep Kit is a split-dose (2day) regimen. Consumption of BOTH 6-ounce bottles is required for a complete prep.  Please notify us immediately if you are diabetic, take iron supplements, or if you are on Coumadin or any other blood thinners.                                                                                                                                                    1 DAY BEFORE PROCEDURE:  DATE: 01/21/18   DAY: Sunday  clear liquids the entire day - NO SOLID FOOD.     At 6:00pm: Complete steps 1 through 4 below, using ONE (1) 6-ounce bottle, before going to bed. Step 1:  Pour ONE (1) 6-ounce bottle of SUPREP liquid into the mixing container.  Step 2:  Add cool drinking water to the 16 ounce line on the container and mix.  Note: Dilute the solution concentrate as directed prior to use. Step 3:  DRINK ALL the liquid in the container. Step 4:  You MUST drink an additional two (2) or more 16 ounce containers of water over the next one (1) hour.   Continue clear liquids.  DAY OF PROCEDURE:   DATE: 01/22/18   DAY: Monday If you take medications for your heart, blood pressure, or breathing, you may take these medications.    5 hours before your procedure at 3:30am: Step 1:  Pour ONE (1) 6-ounce bottle of SUPREP liquid into the mixing container.  Step 2:  Add cool drinking water to the 16 ounce line on the container and mix.  Note: Dilute the solution concentrate as directed prior to use. Step 3:  DRINK ALL the liquid in the container. Step 4:  You MUST drink an additional two (2) or more 16 ounce containers of water over the next one (1) hour. You MUST complete the final glass of water at  least 3 hours before your colonoscopy.    Nothing by mouth past 5:30am  You may take your morning medications with sip of water unless we have instructed otherwise.    Please see below for Dietary Information.  CLEAR LIQUIDS INCLUDE:  Water Jello (NOT red in color)   Ice Popsicles (NOT red in color)   Tea (sugar ok, no milk/cream) Powdered fruit flavored drinks  Coffee (sugar ok, no milk/cream) Gatorade/ Lemonade/ Kool-Aid  (NOT red in color)   Juice: apple, white grape, white cranberry Soft drinks  Clear bullion, consomme, broth (fat free beef/chicken/vegetable)  Carbonated beverages (any kind)  Strained chicken noodle soup Hard Candy  Remember: Clear liquids are liquids that will allow you to see your fingers on the other side of a clear glass. Be sure liquids are NOT red in color, and not cloudy, but CLEAR.  DO NOT EAT OR DRINK ANY OF THE FOLLOWING:  Dairy products of any kind   Cranberry juice Tomato juice / V8 juice   Grapefruit juice Orange juice     Red grape juice  Do not eat any solid foods, including such foods as: cereal, oatmeal, yogurt, fruits, vegetables, creamed soups, eggs, bread, crackers, pureed foods in a blender, etc.   HELPFUL HINTS FOR DRINKING PREP SOLUTION:   Make sure prep is extremely cold. Mix and refrigerate the the morning of the prep. You may also put in the freezer.   You may try mixing some Crystal Light or Country Time Lemonade if you prefer. Mix in small amounts; add more if necessary.  Try drinking through a straw  Rinse mouth with water or a mouthwash between glasses, to remove after-taste.  Try sipping on a cold beverage /ice/ popsicles between glasses of prep.  Place a piece of sugar-free hard candy in mouth between glasses.  If you become nauseated, try consuming smaller amounts, or stretch out the time between glasses. Stop for 30-60 minutes, then slowly start back drinking.     OTHER INSTRUCTIONS  You will need a responsible  adult at least 63 years of age to accompany you and drive you home. This person must remain in the waiting room during your procedure. The hospital will cancel your procedure if you do not have a responsible adult with you.   1. Wear loose fitting clothing that is easily removed. 2. Leave jewelry and other valuables at home.  3. Remove all body piercing jewelry and leave at home. 4. Total time from sign-in until discharge is approximately 2-3 hours. 5. You should go home directly after your procedure and rest. You can resume normal activities the day after your procedure. 6. The day of your procedure you should not:  Drive  Make legal decisions  Operate machinery  Drink alcohol  Return to work   You may call the office (Dept: [phone]) before 5:00pm, or page the doctor on call [phone]) after 5:00pm, for further instructions, if necessary.   Insurance Information YOU WILL NEED TO CHECK WITH YOUR INSURANCE COMPANY FOR THE BENEFITS OF COVERAGE YOU HAVE FOR THIS PROCEDURE.  UNFORTUNATELY, NOT ALL INSURANCE COMPANIES HAVE BENEFITS TO COVER ALL OR PART OF THESE TYPES OF PROCEDURES.  IT IS YOUR RESPONSIBILITY TO CHECK YOUR BENEFITS, HOWEVER, WE WILL BE GLAD TO ASSIST YOU WITH ANY CODES YOUR INSURANCE COMPANY MAY NEED.    PLEASE NOTE THAT MOST INSURANCE COMPANIES WILL NOT COVER A SCREENING COLONOSCOPY FOR PEOPLE UNDER THE AGE OF 50  IF YOU HAVE BCBS INSURANCE, YOU MAY HAVE BENEFITS FOR A SCREENING COLONOSCOPY BUT IF POLYPS ARE FOUND THE DIAGNOSIS WILL CHANGE AND THEN YOU MAY HAVE A DEDUCTIBLE THAT WILL NEED TO BE MET. SO PLEASE MAKE SURE YOU CHECK YOUR BENEFITS FOR A SCREENING COLONOSCOPY AS WELL AS A DIAGNOSTIC COLONOSCOPY.

## 2017-11-15 NOTE — Progress Notes (Signed)
Ok to schedule. Hold iron 7 days before TCS.  

## 2017-11-15 NOTE — Progress Notes (Signed)
Gastroenterology Pre-Procedure Review  Request Date:11/15/17 Requesting Physician: Karmen StabsDr.[facility]- ( previous t[facility]- [facility]- 12-13 years ago, doesn't remember MD name, no polyps per pt report.  Darl PikesSusan will try to get copy of report)   PATIENT REVIEW QUESTIONS: The patient responded to the following health history questions as indicated:    1. Diabetes Melitis: no 2. Joint replacements in the past 12 months: no 3. Major health problems in the past 3 months: no 4. Has an artificial valve or MVP: no 5. Has a defibrillator: no 6. Has been advised in past to take antibiotics in advance of a procedure like teeth cleaning: no 7. Family history of colon cancer: no  8. Alcohol Use: no 9. History of sleep apnea: no  10. History of coronary artery or other vascular stents placed within the last 12 months: no 11. History of any prior anesthesia complications: no    MEDICATIONS & ALLERGIES:    Patient reports the following regarding taking any blood thinners:   Plavix? no Aspirin? yes (81mg ) Coumadin? no Brilinta? no Xarelto? no Eliquis? no Pradaxa? no Savaysa? no Effient? no  Patient confirms/reports the following medications:  Current Outpatient Medications  Medication Sig Dispense Refill  . amLODipine (NORVASC) 5 MG tablet Take 1 tablet (5 mg total) by mouth daily. 90 tablet 3  . aspirin EC 81 MG tablet Take 81 mg by mouth daily.    Marland Kitchen. atenolol (TENORMIN) 50 MG tablet Take 1 tablet (50 mg total) by mouth daily. 90 tablet 3  . Ferrous Sulfate (IRON) 325 (65 Fe) MG TABS Take 1 tablet (325 mg total) by mouth daily. 90 each 0  . Garlic 1000 MG CAPS Take 1,000 mg by mouth daily.    . hydrochlorothiazide (HYDRODIURIL) 25 MG tablet Take 1 tablet (25 mg total) by mouth daily. 90 tablet 3  . Magnesium 250 MG TABS Take by mouth.    . meloxicam (MOBIC) 15 MG tablet Take 1 tablet (15 mg total) by mouth daily. 90 tablet 2  . Multiple Vitamins-Minerals (MULTIVITAMIN WITH MINERALS) tablet Take 1  tablet by mouth daily.    . Omega-3 Fatty Acids (FISH OIL) 1200 MG CAPS Take 1,200 mg by mouth daily.    Marland Kitchen. omeprazole (PRILOSEC) 20 MG capsule TAKE 1 TABLET (20 MG TOTAL) BY MOUTH DAILY. 90 capsule 3  . simvastatin (ZOCOR) 20 MG tablet TAKE 1 TABLET (20 MG TOTAL) BY MOUTH DAILY. 90 tablet 3   No current facility-administered medications for this visit.     Patient confirms/reports the following allergies:  Allergies  Allergen Reactions  . Crestor [Rosuvastatin]     Lightheadedness.    No orders of the defined types were placed in this encounter.   AUTHORIZATION INFORMATION Primary Insurance: [employer]: [id number] Pre-Cert / Berkley HarveyAuth required: no   SCHEDULE INFORMATION: Procedure has been scheduled as follows:  Date: 01/22/18, Time: 8:30 Location: [facility]  This Gastroenterology Pre-Precedure Review Form is being routed to the following provider(s): Tana CoastLeslie Lewis, PA

## 2017-11-16 NOTE — Progress Notes (Signed)
Letter mailed to the pt with iron instructions.  

## 2017-12-26 DIAGNOSIS — Z23 Encounter for immunization: Secondary | ICD-10-CM | POA: Diagnosis not present

## 2018-01-11 ENCOUNTER — Ambulatory Visit (INDEPENDENT_AMBULATORY_CARE_PROVIDER_SITE_OTHER): Payer: BLUE CROSS/BLUE SHIELD

## 2018-01-11 ENCOUNTER — Ambulatory Visit: Payer: BLUE CROSS/BLUE SHIELD | Admitting: Family

## 2018-01-11 ENCOUNTER — Encounter: Payer: Self-pay | Admitting: Family

## 2018-01-11 VITALS — BP 151/71 | HR 54 | Temp 97.9°F | Ht 63.5 in | Wt 222.6 lb

## 2018-01-11 DIAGNOSIS — M1611 Unilateral primary osteoarthritis, right hip: Secondary | ICD-10-CM

## 2018-01-11 DIAGNOSIS — M25551 Pain in right hip: Secondary | ICD-10-CM

## 2018-01-11 MED ORDER — PREDNISONE 10 MG (21) PO TBPK: ORAL_TABLET | ORAL | 0 refills | Status: DC

## 2018-01-11 NOTE — Progress Notes (Signed)
   Subjective:    Patient ID: Carla Simmons, female    DOB: 1955-05-11, 62 y.o.   MRN: [id number]  Chief Complaint  Patient presents with  . Hip Pain    right    Hip Pain   The incident occurred 3 to 5 days ago. There was no injury mechanism. The pain is present in the right hip. The quality of the pain is described as aching. The pain is moderate. The pain has been intermittent since onset. Pertinent negatives include no numbness or tingling. She reports no foreign bodies present. The symptoms are aggravated by weight bearing and movement. She has tried acetaminophen, NSAIDs and rest for the symptoms. The treatment provided mild relief.      Review of Systems  Neurological: Negative for tingling and numbness.  All other systems reviewed and are negative.      Objective:   Physical Exam  Constitutional: She is oriented to person, place, and time. She appears well-developed and well-nourished. No distress.  HENT:  Head: Normocephalic.  Eyes: Pupils are equal, round, and reactive to light.  Neck: Normal range of motion. Neck supple. No thyromegaly present.  Cardiovascular: Normal rate, regular rhythm, normal heart sounds and intact distal pulses.  No murmur heard. Pulmonary/Chest: Effort normal and breath sounds normal. No respiratory distress. She has no wheezes.  Abdominal: Soft. Bowel sounds are normal. She exhibits no distension. There is no tenderness.  Musculoskeletal: She exhibits tenderness. She exhibits no edema.  Right hip pain with rotation and flexion  Neurological: She is alert and oriented to person, place, and time. She has normal reflexes. No cranial nerve deficit.  Skin: Skin is warm and dry.  Psychiatric: She has a normal mood and affect. Her behavior is normal. Judgment and thought content normal.  Vitals reviewed.   X-ray- Negative for flexion, Preliminary reading by Jannifer Rodney, FNP [facility]   BP (!) 151/71   Pulse (!) 54   Temp 97.9 F (36.6 C) (Oral)    Ht 5' 3.5" (1.613 m)   Wt 222 lb 9.6 oz (101 kg)   BMI 38.81 kg/m      Assessment & Plan:  Cortasia Screws comes in today with chief complaint of Hip Pain (right)   Diagnosis and orders addressed:  1. Right hip pain - DG HIP UNILAT W OR W/O PELVIS 2-3 VIEWS RIGHT; Future  2. Primary osteoarthritis of right hip Continue Mobic Rest Ice ROM exercises  Keep follow up with PCP - predniSONE (STERAPRED UNI-PAK 21 TAB) 10 MG (21) TBPK tablet; Use as directed  Dispense: 21 tablet; Refill: 0 - Ambulatory referral to Orthopedic Surgery   Jannifer Rodney, FNP

## 2018-01-11 NOTE — Patient Instructions (Signed)

## 2018-01-12 ENCOUNTER — Other Ambulatory Visit: Payer: Self-pay

## 2018-01-12 ENCOUNTER — Telehealth: Payer: Self-pay | Admitting: Pediatrics

## 2018-01-12 ENCOUNTER — Other Ambulatory Visit: Payer: BLUE CROSS/BLUE SHIELD

## 2018-01-12 ENCOUNTER — Other Ambulatory Visit: Payer: Self-pay | Admitting: Pediatrics

## 2018-01-12 DIAGNOSIS — M545 Low back pain, unspecified: Secondary | ICD-10-CM

## 2018-01-12 DIAGNOSIS — R399 Unspecified symptoms and signs involving the genitourinary system: Secondary | ICD-10-CM

## 2018-01-12 LAB — URINALYSIS, COMPLETE
Bilirubin, UA: NEGATIVE
GLUCOSE, UA: NEGATIVE
Ketones, UA: NEGATIVE
LEUKOCYTES UA: NEGATIVE
Nitrite, UA: NEGATIVE
RBC, UA: NEGATIVE
Specific Gravity, UA: >1.03 — ABNORMAL HIGH (ref 1.005–1.030)
Urobilinogen, Ur: 0.2 mg/dL (ref 0.2–1.0)
pH, UA: 5.5 (ref 5.0–7.5)

## 2018-01-12 LAB — MICROSCOPIC EXAMINATION: Renal Epithel, UA: NONE SEEN /hpf

## 2018-01-15 ENCOUNTER — Emergency Department (HOSPITAL_COMMUNITY): Payer: BLUE CROSS/BLUE SHIELD

## 2018-01-15 ENCOUNTER — Encounter (HOSPITAL_COMMUNITY): Payer: Self-pay | Admitting: Emergency Medicine

## 2018-01-15 ENCOUNTER — Emergency Department (HOSPITAL_COMMUNITY)
Admission: EM | Admit: 2018-01-15 | Discharge: 2018-01-15 | Disposition: A | Discharge status: Home / Self Care | Payer: BLUE CROSS/BLUE SHIELD | Attending: Emergency Medicine | Admitting: Emergency Medicine

## 2018-01-15 DIAGNOSIS — Z7982 Long term (current) use of aspirin: Secondary | ICD-10-CM | POA: Diagnosis not present

## 2018-01-15 DIAGNOSIS — Z87891 Personal history of nicotine dependence: Secondary | ICD-10-CM | POA: Insufficient documentation

## 2018-01-15 DIAGNOSIS — N83292 Other ovarian cyst, left side: Secondary | ICD-10-CM | POA: Insufficient documentation

## 2018-01-15 DIAGNOSIS — Z79899 Other long term (current) drug therapy: Secondary | ICD-10-CM | POA: Diagnosis not present

## 2018-01-15 DIAGNOSIS — N839 Noninflammatory disorder of ovary, fallopian tube and broad ligament, unspecified: Secondary | ICD-10-CM

## 2018-01-15 DIAGNOSIS — R109 Unspecified abdominal pain: Secondary | ICD-10-CM | POA: Diagnosis not present

## 2018-01-15 DIAGNOSIS — M545 Low back pain, unspecified: Secondary | ICD-10-CM

## 2018-01-15 DIAGNOSIS — N838 Other noninflammatory disorders of ovary, fallopian tube and broad ligament: Secondary | ICD-10-CM | POA: Diagnosis not present

## 2018-01-15 DIAGNOSIS — I10 Essential (primary) hypertension: Secondary | ICD-10-CM | POA: Insufficient documentation

## 2018-01-15 DIAGNOSIS — R110 Nausea: Secondary | ICD-10-CM | POA: Diagnosis not present

## 2018-01-15 LAB — URINALYSIS, ROUTINE W REFLEX MICROSCOPIC
BILIRUBIN URINE: NEGATIVE
GLUCOSE, UA: NEGATIVE mg/dL
Hgb urine dipstick: NEGATIVE
Ketones, ur: NEGATIVE mg/dL
LEUKOCYTES UA: NEGATIVE
Nitrite: NEGATIVE
PH: 7 (ref 5.0–8.0)
PROTEIN: NEGATIVE mg/dL
Specific Gravity, Urine: 1.004 — ABNORMAL LOW (ref 1.005–1.030)

## 2018-01-15 MED ORDER — NAPROXEN 500 MG PO TABS: 500.0000 mg | ORAL_TABLET | Freq: Two times a day (BID) | ORAL | 0 refills | Status: DC

## 2018-01-15 MED ORDER — METHOCARBAMOL 500 MG PO TABS: 500.0000 mg | ORAL_TABLET | Freq: Every evening | ORAL | 0 refills | Status: DC | PRN

## 2018-01-15 MED ORDER — KETOROLAC TROMETHAMINE 60 MG/2ML IM SOLN
15.0000 mg | Freq: Once | INTRAMUSCULAR | Status: AC
  Administered 2018-01-15: 15 mg via INTRAMUSCULAR
  Filled 2018-01-15: qty 2

## 2018-01-15 NOTE — ED Triage Notes (Signed)
Pt reports she has been having lower right back pain for a week.  Was seen at pcp twice and dx with hip pain the first visit and then had urine test 2nd visit where pcp sent culture but did not treat.  States her pain is becoming unbearable.  Denies fever, chills, or vomiting.

## 2018-01-15 NOTE — Discharge Instructions (Addendum)
Take naproxen 2 times a day with meals.  Do not take other anti-inflammatories at the same time (Advil, Motrin, ibuprofen, Aleve). You may supplement with Tylenol if you need further pain control. Use Robaxin as needed for muscle stiffness or soreness. Have caution, as this may make you tired or groggy. Do not drive or operate heavy machinery while taking this medication.  Use muscle creams (bengay, icy hot, salonpas) as needed for pain. Use heat or ice to help with your pain   Follow up with your primary care doctor if pain is not improving with this treatment in 1 week.  Return to the ER if you develop high fevers, numbness, loss of bowel or bladder control, or any new or concerning symptoms.

## 2018-01-15 NOTE — ED Provider Notes (Signed)
[facility] EMERGENCY DEPARTMENT Provider Note   CSN: [id number] Arrival date & time: 01/15/18  2330     History   Chief Complaint Chief Complaint  Patient presents with  . Flank Pain    HPI Carla Simmons is a 62 y.o. female presenting for evaluation of right low back pain.  Patient states pain has been present for the past week.  It began as a mild pain, and got much more severe.  She saw her primary care doctor, who did x-rays of her hip and started her on prednisone.  She took 1 dose of prednisone, but could not tolerate the way it made her feel.  She then followed up with her PCP again, she had a urine which was equivocal.  Urine was sent for culture, patient encouraged to continue taking Tylenol.  Patient presents today, his pain is persistent.  She reports pain is worse with palpation and movement.  Tylenol improves the pain, but does not resolve it.  She denies radiation down her leg.  Pain comes in waves, when pain is very strong she has associated nausea without vomiting.  She denies fevers, chills, chest pain, shortness of breath, anterior abdominal pain.  She denies dysuria, hematuria, urinary frequency.  Pain is not worse with urination.  She has normal bowel movements.  Patient states she has a history of kidney stones, is concerned this might be another kidney stone, although the symptoms are slightly different.  PMH of hypertension and hyperlipidemia.   Additional history obtained from chart review, patient's recent urine with many bacteria but no nitrites, leuks or blood.  HPI  Past Medical History:  Diagnosis Date  . Hyperlipidemia   . Hypertension     Patient Active Problem List   Diagnosis Date Noted  . Pre-diabetes 12/23/2016  . Osteopenia 09/11/2014  . Palpitations 06/28/2013  . PVC's (premature ventricular contractions) 06/28/2013  . HTN (hypertension) 04/16/2013  . HLD (hyperlipidemia) 04/16/2013  . GERD (gastroesophageal reflux disease) 04/16/2013     History reviewed. No pertinent surgical history.   OB History   None      Home Medications    Prior to Admission medications   Medication Sig Start Date End Date Taking? Authorizing Provider  amLODipine (NORVASC) 5 MG tablet Take 1 tablet (5 mg total) by mouth daily. 12/22/16  Yes Eustaquio Maize, MD  aspirin EC 81 MG tablet Take 81 mg by mouth daily.   Yes [provider]  atenolol (TENORMIN) 50 MG tablet Take 1 tablet (50 mg total) by mouth daily. 12/22/16  Yes Eustaquio Maize, MD  Ferrous Sulfate (IRON) 325 (65 Fe) MG TABS Take 1 tablet (325 mg total) by mouth daily. 06/21/17  Yes Eustaquio Maize, MD  Garlic 0762 MG CAPS Take 1,000 mg by mouth daily.   Yes [provider]  hydrochlorothiazide (HYDRODIURIL) 25 MG tablet Take 1 tablet (25 mg total) by mouth daily. 06/21/17  Yes Eustaquio Maize, MD  Magnesium 250 MG TABS Take by mouth.   Yes [provider]  meloxicam (MOBIC) 15 MG tablet Take 1 tablet (15 mg total) by mouth daily. 06/21/17  Yes Eustaquio Maize, MD  Multiple Vitamins-Minerals (MULTIVITAMIN WITH MINERALS) tablet Take 1 tablet by mouth daily.   Yes [provider]  Omega-3 Fatty Acids (FISH OIL) 1200 MG CAPS Take 1,200 mg by mouth daily.   Yes [provider]  omeprazole (PRILOSEC) 20 MG capsule TAKE 1 TABLET (20 MG TOTAL) BY MOUTH DAILY.  06/21/17  Yes Eustaquio Maize, MD  simvastatin (ZOCOR) 20 MG tablet TAKE 1 TABLET (20 MG TOTAL) BY MOUTH DAILY. 12/22/16  Yes Eustaquio Maize, MD  methocarbamol (ROBAXIN) 500 MG tablet Take 1 tablet (500 mg total) by mouth at bedtime as needed for muscle spasms. 01/15/18   Briah Nary, PA-C  Na Sulfate-K Sulfate-Mg Sulf (SUPREP BOWEL PREP KIT) 17.5-3.13-1.6 GM/177ML SOLN Take 1 kit by mouth as directed. Patient not taking: Reported on 01/11/2018 11/15/17   Mahala Menghini, PA-C  naproxen (NAPROSYN) 500 MG tablet Take 1 tablet (500 mg total) by mouth 2 (two) times daily with a meal.  01/15/18   Aiden Helzer, PA-C    Family History Family History  Problem Relation Age of Onset  . Diabetes Mother   . Heart disease Father     Social History Social History   Tobacco Use  . Smoking status: Former Smoker    Types: Cigarettes    Last attempt to quit: 12/22/2015    Years since quitting: 2.0  . Smokeless tobacco: Never Used  Substance Use Topics  . Alcohol use: No  . Drug use: No     Allergies   Crestor [rosuvastatin]   Review of Systems Review of Systems  Gastrointestinal: Positive for nausea.  Genitourinary: Positive for flank pain.  Musculoskeletal: Positive for back pain.  All other systems reviewed and are negative.    Physical Exam Updated Vital Signs BP (!) 182/80   Pulse 63   Temp 98 F (36.7 C) (Oral)   Resp 18   Ht '5\' 4"'  (1.626 m)   Wt 101 kg   SpO2 99%   BMI 38.22 kg/m   Physical Exam  Constitutional: She is oriented to person, place, and time. She appears well-developed and well-nourished. No distress.  Obese female in no acute distress  HENT:  Head: Normocephalic and atraumatic.  Eyes: Pupils are equal, round, and reactive to light. Conjunctivae and EOM are normal.  Neck: Normal range of motion. Neck supple.  Cardiovascular: Normal rate, regular rhythm and intact distal pulses.  Pulmonary/Chest: Effort normal and breath sounds normal. No respiratory distress. She has no wheezes.  Abdominal: Soft. She exhibits no distension and no mass. There is no tenderness. There is no rebound and no guarding.  No tenderness palpation of the anterior abdomen.  Soft without rigidity, guarding, distention.  Musculoskeletal: Normal range of motion. She exhibits tenderness.  Tenderness palpation of right mid and low back musculature.  No pain over midline spine.  No pain on the left side.  Patient is ambulatory.  No radiation of pain with straight leg raise.  Neurological: She is alert and oriented to person, place, and time. No sensory  deficit.  Skin: Skin is warm and dry. Capillary refill takes less than 2 seconds.  Psychiatric: She has a normal mood and affect.  Nursing note and vitals reviewed.    ED Treatments / Results  Labs (all labs ordered are listed, but only abnormal results are displayed) Labs Reviewed  URINALYSIS, ROUTINE W REFLEX MICROSCOPIC - Abnormal; Notable for the following components:      Result Value   Specific Gravity, Urine 1.004 (*)    All other components within normal limits    EKG None  Radiology Ct Renal Stone Study  Result Date: 01/15/2018 CLINICAL DATA:  Right flank pain. EXAM: CT ABDOMEN AND PELVIS WITHOUT CONTRAST TECHNIQUE: Multidetector CT imaging of the abdomen and pelvis was performed following the standard protocol without IV contrast. COMPARISON:  CT abdomen pelvis 02/12/2012 FINDINGS: Lower chest: Lung bases clear bilaterally. Heart size within normal limits. Hepatobiliary: No focal liver abnormality is seen. No gallstones, gallbladder wall thickening, or biliary dilatation. Pancreas: Negative Spleen: Negative Adrenals/Urinary Tract: Adrenal glands are unremarkable. Kidneys are normal, without renal calculi, focal lesion, or hydronephrosis. Bladder is unremarkable. Stomach/Bowel: Negative for bowel obstruction. Mild sigmoid diverticulosis. Normal appendix. No bowel mass or edema. Vascular/Lymphatic: Mild atherosclerotic aorta without aneurysm. Negative for lymphadenopathy. Reproductive: Left adnexal cyst 4 x 5 cm. Previously this measured 2.5 x 3.0 cm. This appears to be a simple cyst however given the interval growth, cystic neoplasm is possible. Negative uterus. No free fluid. Other: None Musculoskeletal: Sclerotic changes in the left hemipelvis , unchanged from the prior CT. IMPRESSION: Negative for urinary tract calculi. No cause for acute abdominal pain 4 x 5 cm left adnexal cyst with interval growth since 2013. Cystic neoplasm of the ovary is possible. Recommend gynecologic  evaluation. Stable chronic sclerotic changes in the left hemipelvis. Cortical thickening left iliac and ischial bone and left femur. Possible melorheostosis versus Paget's disease. Electronically Signed   By: Franchot Gallo M.D.   On: 01/15/2018 12:27    Procedures Procedures (including critical care time)  Medications Ordered in ED Medications  ketorolac (TORADOL) injection 15 mg (15 mg Intramuscular Given 01/15/18 1130)     Initial Impression / Assessment and Plan / ED Course  I have reviewed the triage vital signs and the nursing notes.  Pertinent labs & imaging results that were available during my care of the patient were reviewed by me and considered in my medical decision making (see chart for details).     Pt presenting for evaluation of right mid/low back pain.  Physical exam reassuring, she is afebrile not tachycardic.  Appears nontoxic.  Patient with recent equivocal urine, consider possible Pyelo versus kidney stone.  Additionally, consider MSK causes.  Will obtain urine and CT renal for further evaluation.  Toradol for pain.  Urine without blood, nitrites, leuks, doubt UTI or pyelo.  CT renal without perinephric stranding or kidney stone.  Discussed findings with patient.  Discussed likely MSK causes.  On reassessment, patient reports pain is much improved with Toradol.  Will treat with NSAIDs, muscle relaxers, muscle creams.  Patient to follow-up with her PCP for further evaluation.  Additionally, CT showed increased size of left sided ovarian cyst.  Discussed findings with patient, and importance of follow-up with OB/GYN.  At this time, patient appears safe for discharge.  Return precautions given.  Patient states she understands and agrees to plan.   Final Clinical Impressions(s) / ED Diagnoses   Final diagnoses:  Acute right-sided low back pain without sciatica  Lesion of ovary    ED Discharge Orders         Ordered    naproxen (NAPROSYN) 500 MG tablet  2 times daily  with meals     01/15/18 1253    methocarbamol (ROBAXIN) 500 MG tablet  At bedtime PRN     01/15/18 1253           Teshawn Moan, PA-C 01/15/18 1302    Davonna Belling, MD 01/15/18 [phone]

## 2018-01-18 ENCOUNTER — Other Ambulatory Visit: Payer: Self-pay | Admitting: Pediatrics

## 2018-01-18 ENCOUNTER — Telehealth: Payer: Self-pay | Admitting: *Deleted

## 2018-01-18 DIAGNOSIS — E785 Hyperlipidemia, unspecified: Secondary | ICD-10-CM

## 2018-01-18 DIAGNOSIS — I10 Essential (primary) hypertension: Secondary | ICD-10-CM

## 2018-01-18 NOTE — OR Nursing (Signed)
Patient called and said she wanted to cancel her procedure for Monday, 01/22/2018 because she is going to be out of town. Left a voicemail for Mindy at [facility] to inform her of the above.

## 2018-01-18 NOTE — Telephone Encounter (Signed)
noted 

## 2018-01-18 NOTE — Telephone Encounter (Signed)
Received VM from [place] in [facility]. Patient cancelled TCS d/t being out of town. Fowarding to [place].

## 2018-01-22 ENCOUNTER — Ambulatory Visit (HOSPITAL_COMMUNITY): Admit: 2018-01-22 | Payer: BLUE CROSS/BLUE SHIELD | Admitting: Gastroenterology

## 2018-01-22 ENCOUNTER — Encounter (HOSPITAL_COMMUNITY): Payer: Self-pay

## 2018-01-22 SURGERY — COLONOSCOPY
Anesthesia: Moderate Sedation

## 2018-02-27 ENCOUNTER — Other Ambulatory Visit: Payer: Self-pay | Admitting: Pediatrics

## 2018-02-27 DIAGNOSIS — I10 Essential (primary) hypertension: Secondary | ICD-10-CM

## 2018-03-07 DIAGNOSIS — E785 Hyperlipidemia, unspecified: Secondary | ICD-10-CM | POA: Diagnosis not present

## 2018-03-07 DIAGNOSIS — I10 Essential (primary) hypertension: Secondary | ICD-10-CM | POA: Diagnosis not present

## 2018-03-07 DIAGNOSIS — Z008 Encounter for other general examination: Secondary | ICD-10-CM | POA: Diagnosis not present

## 2018-03-07 DIAGNOSIS — Z719 Counseling, unspecified: Secondary | ICD-10-CM | POA: Diagnosis not present

## 2018-03-16 ENCOUNTER — Other Ambulatory Visit: Payer: Self-pay | Admitting: *Deleted

## 2018-03-16 DIAGNOSIS — I10 Essential (primary) hypertension: Secondary | ICD-10-CM

## 2018-03-16 MED ORDER — ATENOLOL 50 MG PO TABS: 50.0000 mg | ORAL_TABLET | Freq: Every day | ORAL | 0 refills | Status: DC

## 2018-04-10 ENCOUNTER — Telehealth: Payer: Self-pay | Admitting: Pediatrics

## 2018-04-10 DIAGNOSIS — I10 Essential (primary) hypertension: Secondary | ICD-10-CM

## 2018-04-10 MED ORDER — ATENOLOL 50 MG PO TABS: 50.0000 mg | ORAL_TABLET | Freq: Every day | ORAL | 0 refills | Status: DC

## 2018-04-10 NOTE — Telephone Encounter (Signed)
LMOVM 30 days sent to pharmacy 

## 2018-04-10 NOTE — Telephone Encounter (Signed)
Pharmacy [facility]   Pt has apt for 1/15 and is out of atenolol (TENORMIN) 50 MG tablet can we please send some in to the pharmacy

## 2018-04-18 ENCOUNTER — Encounter: Payer: Self-pay | Admitting: Pediatrics

## 2018-04-18 ENCOUNTER — Ambulatory Visit: Payer: BLUE CROSS/BLUE SHIELD | Admitting: Pediatrics

## 2018-04-18 VITALS — BP 138/73 | HR 53 | Temp 96.9°F | Ht 64.0 in | Wt 223.0 lb

## 2018-04-18 DIAGNOSIS — E785 Hyperlipidemia, unspecified: Secondary | ICD-10-CM

## 2018-04-18 DIAGNOSIS — M1711 Unilateral primary osteoarthritis, right knee: Secondary | ICD-10-CM | POA: Diagnosis not present

## 2018-04-18 DIAGNOSIS — I10 Essential (primary) hypertension: Secondary | ICD-10-CM

## 2018-04-18 DIAGNOSIS — R7303 Prediabetes: Secondary | ICD-10-CM

## 2018-04-18 DIAGNOSIS — K219 Gastro-esophageal reflux disease without esophagitis: Secondary | ICD-10-CM

## 2018-04-18 LAB — BAYER DCA HB A1C WAIVED: HB A1C (BAYER DCA - WAIVED): 6 % (ref <7.0)

## 2018-04-18 MED ORDER — AMLODIPINE BESYLATE 5 MG PO TABS: 5.0000 mg | ORAL_TABLET | Freq: Every day | ORAL | 2 refills | Status: DC

## 2018-04-18 MED ORDER — SIMVASTATIN 20 MG PO TABS: ORAL_TABLET | ORAL | 2 refills | Status: DC

## 2018-04-18 MED ORDER — ATENOLOL 50 MG PO TABS: 50.0000 mg | ORAL_TABLET | Freq: Every day | ORAL | 2 refills | Status: DC

## 2018-04-18 MED ORDER — HYDROCHLOROTHIAZIDE 25 MG PO TABS: 25.0000 mg | ORAL_TABLET | Freq: Every day | ORAL | 3 refills | Status: DC

## 2018-04-18 MED ORDER — MELOXICAM 15 MG PO TABS: 15.0000 mg | ORAL_TABLET | Freq: Every day | ORAL | 2 refills | Status: DC

## 2018-04-18 MED ORDER — OMEPRAZOLE 20 MG PO CPDR: DELAYED_RELEASE_CAPSULE | ORAL | 3 refills | Status: AC

## 2018-04-18 NOTE — Progress Notes (Signed)
  Subjective:   Patient ID: Carla Simmons, female    DOB: 11/03/1955, 63 y.o.   MRN: [id number] CC: Medical Management of Chronic Issues  HPI: Carla Simmons is a 63 y.o. female   Hypertension: Taking medicine regularly.  No lightheadedness, chest pain with exertion  Knee osteoarthritis: Taking meloxicam.  Does help his pain some.  Hyperlipidemia: Tolerating statin  GERD: Taking omeprazole.  Helps with symptoms.  Overall has been feeling well.  Energy levels have been good.  No recent fevers, runny nose or other URI symptoms.  No cough.  Mood has been fine.  Relevant past medical, surgical, family and social history reviewed. Allergies and medications reviewed and updated. Social History   Tobacco Use  Smoking Status Former Smoker  . Types: Cigarettes  . Last attempt to quit: 12/22/2015  . Years since quitting: 2.3  Smokeless Tobacco Never Used   ROS: Per HPI   Objective:    BP 138/73   Pulse (!) 53   Temp (!) 96.9 F (36.1 C) (Oral)   Ht '5\' 4"'$  (1.626 m)   Wt 223 lb (101.2 kg)   BMI 38.28 kg/m   Wt Readings from Last 3 Encounters:  04/18/18 223 lb (101.2 kg)  01/15/18 222 lb 10.6 oz (101 kg)  01/11/18 222 lb 9.6 oz (101 kg)    Gen: NAD, alert, cooperative with exam, NCAT EYES: EOMI, no conjunctival injection, or no icterus ENT:  TMs pearly gray b/l, OP without erythema LYMPH: no cervical LAD CV: NRRR, normal S1/S2, no murmur, distal pulses 2+ b/l Resp: CTABL, no wheezes, normal WOB Abd: +BS, soft, NTND. no guarding or organomegaly Ext: No edema, warm Neuro: Alert and oriented, strength equal b/l UE and LE, coordination grossly normal MSK: normal muscle bulk  Assessment & Plan:  Camella was seen today for medical management of chronic issues.  Diagnoses and all orders for this visit:  Essential hypertension Stable, continue below.  Check labs -     hydrochlorothiazide (HYDRODIURIL) 25 MG tablet; Take 1 tablet (25 mg total) by mouth daily. -     amLODipine  (NORVASC) 5 MG tablet; Take 1 tablet (5 mg total) by mouth daily. (Needs to be seen before next refill) -     atenolol (TENORMIN) 50 MG tablet; Take 1 tablet (50 mg total) by mouth daily. -     BMP8+EGFR  Osteoarthritis of right knee, unspecified osteoarthritis type Stable, continue below.  Will check kidney function today. -     meloxicam (MOBIC) 15 MG tablet; Take 1 tablet (15 mg total) by mouth daily.  Hyperlipidemia, unspecified hyperlipidemia type Stable, continue below -     simvastatin (ZOCOR) 20 MG tablet; TAKE 1 TABLET BY MOUTH EVERY DAY  Gastroesophageal reflux disease, esophagitis presence not specified Stable, continue below -     omeprazole (PRILOSEC) 20 MG capsule; TAKE 1 TABLET (20 MG TOTAL) BY MOUTH DAILY.  Pre-diabetes -     Bayer DCA Hb A1c Waived  Follow up plan: Return in about 6 months (around 10/17/2018). Assunta Found, MD [place]

## 2018-04-19 LAB — BMP8+EGFR
BUN/Creatinine Ratio: 25 (ref 12–28)
BUN: 18 mg/dL (ref 8–27)
CALCIUM: 9.5 mg/dL (ref 8.7–10.3)
CO2: 27 mmol/L (ref 20–29)
Chloride: 100 mmol/L (ref 96–106)
Creatinine, Ser: 0.72 mg/dL (ref 0.57–1.00)
GFR calc Af Amer: 103 mL/min/{1.73_m2} (ref >59)
GFR, EST NON AFRICAN AMERICAN: 89 mL/min/{1.73_m2} (ref >59)
Glucose: 96 mg/dL (ref 65–99)
Potassium: 4.2 mmol/L (ref 3.5–5.2)
Sodium: 145 mmol/L — ABNORMAL HIGH (ref 134–144)

## 2018-05-03 ENCOUNTER — Ambulatory Visit: Payer: BLUE CROSS/BLUE SHIELD | Admitting: Family Medicine

## 2018-05-03 ENCOUNTER — Encounter: Payer: Self-pay | Admitting: Family Medicine

## 2018-05-03 VITALS — BP 135/76 | HR 56 | Temp 97.1°F | Ht 64.0 in | Wt 213.0 lb

## 2018-05-03 DIAGNOSIS — I10 Essential (primary) hypertension: Secondary | ICD-10-CM | POA: Diagnosis not present

## 2018-05-03 NOTE — Progress Notes (Signed)
Subjective:    Patient ID: Carla Simmons, female    DOB: 11-May-1955, 63 y.o.   MRN: [id number]  Chief Complaint:  Blood pressure has been elevated recently (at home and at dentist's office recently)   HPI: Carla Simmons is a 63 y.o. female presenting on 05/03/2018 for Blood pressure has been elevated recently (at home and at dentist's office recently)   1. Essential hypertension    Complaint with meds - Yes Checking BP at home ranging 130/80, states it was 151/85 at the dental office when she went for a tooth extraction. She states they would not complete the extraction due to the elevated blood pressure.  Exercising Regularly - No Watching Salt intake - Yes Pertinent ROS:  Headache - No Chest pain - No Dyspnea - No Palpitations - No LE edema - No They report good compliance with medications and can restate their regimen by memory. No medication side effects.  BP Readings from Last 3 Encounters:  05/03/18 135/76  04/18/18 138/73  01/15/18 (!) 182/80      Relevant past medical, surgical, family, and social history reviewed and updated as indicated.  Allergies and medications reviewed and updated.   Past Medical History:  Diagnosis Date  . Hyperlipidemia   . Hypertension     History reviewed. No pertinent surgical history.  Social History   Socioeconomic History  . Marital status: Married    Spouse name: Not on file  . Number of children: Not on file  . Years of education: Not on file  . Highest education level: Not on file  Occupational History  . Not on file  Social Needs  . Financial resource strain: Not on file  . Food insecurity:    Worry: Not on file    Inability: Not on file  . Transportation needs:    Medical: Not on file    Non-medical: Not on file  Tobacco Use  . Smoking status: Former Smoker    Types: Cigarettes    Last attempt to quit: 12/22/2015    Years since quitting: 2.3  . Smokeless tobacco: Never Used  Substance and Sexual  Activity  . Alcohol use: No  . Drug use: No  . Sexual activity: Not on file  Lifestyle  . Physical activity:    Days per week: Not on file    Minutes per session: Not on file  . Stress: Not on file  Relationships  . Social connections:    Talks on phone: Not on file    Gets together: Not on file    Attends religious service: Not on file    Active member of club or organization: Not on file    Attends meetings of clubs or organizations: Not on file    Relationship status: Not on file  . Intimate partner violence:    Fear of current or ex partner: Not on file    Emotionally abused: Not on file    Physically abused: Not on file    Forced sexual activity: Not on file  Other Topics Concern  . Not on file  Social History Narrative  . Not on file    Outpatient Encounter Medications as of 05/03/2018  Medication Sig  . amLODipine (NORVASC) 5 MG tablet Take 1 tablet (5 mg total) by mouth daily. (Needs to be seen before next refill)  . aspirin EC 81 MG tablet Take 81 mg by mouth daily.  Marland Kitchen atenolol (TENORMIN) 50 MG tablet Take 1 tablet (  50 mg total) by mouth daily.  . Ferrous Sulfate (IRON) 325 (65 Fe) MG TABS Take 1 tablet (325 mg total) by mouth daily.  . Garlic 0037 MG CAPS Take 1,000 mg by mouth daily.  . hydrochlorothiazide (HYDRODIURIL) 25 MG tablet Take 1 tablet (25 mg total) by mouth daily.  . Magnesium 250 MG TABS Take by mouth.  . meloxicam (MOBIC) 15 MG tablet Take 1 tablet (15 mg total) by mouth daily.  . Multiple Vitamins-Minerals (MULTIVITAMIN WITH MINERALS) tablet Take 1 tablet by mouth daily.  . Na Sulfate-K Sulfate-Mg Sulf (SUPREP BOWEL PREP KIT) 17.5-3.13-1.6 GM/177ML SOLN Take 1 kit by mouth as directed.  . Omega-3 Fatty Acids (FISH OIL) 1200 MG CAPS Take 1,200 mg by mouth daily.  Marland Kitchen omeprazole (PRILOSEC) 20 MG capsule TAKE 1 TABLET (20 MG TOTAL) BY MOUTH DAILY.  . simvastatin (ZOCOR) 20 MG tablet TAKE 1 TABLET BY MOUTH EVERY DAY   No facility-administered encounter  medications on file as of 05/03/2018.     Allergies  Allergen Reactions  . Crestor [Rosuvastatin]     Lightheadedness.    Review of Systems  Constitutional: Negative for chills, fatigue and fever.  Eyes: Negative for photophobia and visual disturbance.  Respiratory: Negative for chest tightness and shortness of breath.   Cardiovascular: Negative for chest pain, palpitations and leg swelling.  Neurological: Negative for dizziness, tremors, seizures, syncope, facial asymmetry, speech difficulty, weakness, light-headedness, numbness and headaches.  Psychiatric/Behavioral: Negative for confusion.  All other systems reviewed and are negative.       Objective:    BP 135/76   Pulse (!) 56   Temp (!) 97.1 F (36.2 C) (Oral)   Ht '5\' 4"'  (1.626 m)   Wt 213 lb (96.6 kg)   BMI 36.56 kg/m    Wt Readings from Last 3 Encounters:  05/03/18 213 lb (96.6 kg)  04/18/18 223 lb (101.2 kg)  01/15/18 222 lb 10.6 oz (101 kg)    Physical Exam Vitals signs and nursing note reviewed.  Constitutional:      General: She is not in acute distress.    Appearance: Normal appearance. She is well-developed and well-groomed.  HENT:     Head: Normocephalic and atraumatic.     Mouth/Throat:     Mouth: Mucous membranes are moist.     Pharynx: Oropharynx is clear.  Eyes:     Extraocular Movements: Extraocular movements intact.     Conjunctiva/sclera: Conjunctivae normal.     Pupils: Pupils are equal, round, and reactive to light.  Neck:     Musculoskeletal: Normal range of motion and neck supple.     Vascular: No carotid bruit.  Cardiovascular:     Rate and Rhythm: Normal rate and regular rhythm.     Chest Wall: PMI is not displaced.     Heart sounds: Normal heart sounds. No murmur. No friction rub. No gallop.   Pulmonary:     Effort: Pulmonary effort is normal.  Skin:    General: Skin is warm and dry.     Capillary Refill: Capillary refill takes less than 2 seconds.  Neurological:      General: No focal deficit present.     Mental Status: She is alert and oriented to person, place, and time.     Cranial Nerves: No cranial nerve deficit.     Sensory: No sensory deficit.     Motor: No weakness.     Coordination: Coordination normal.     Gait: Gait normal.  Deep Tendon Reflexes: Reflexes normal.  Psychiatric:        Mood and Affect: Mood normal.        Behavior: Behavior normal. Behavior is cooperative.        Thought Content: Thought content normal.        Judgment: Judgment normal.     Results for orders placed or performed in visit on 04/18/18  Bayer DCA Hb A1c Waived  Result Value Ref Range   HB A1C (BAYER DCA - WAIVED) 6.0 <7.0 %  BMP8+EGFR  Result Value Ref Range   Glucose 96 65 - 99 mg/dL   BUN 18 8 - 27 mg/dL   Creatinine, Ser 0.72 0.57 - 1.00 mg/dL   GFR calc non Af Amer 89 >59 mL/min/1.73   GFR calc Af Amer 103 >59 mL/min/1.73   BUN/Creatinine Ratio 25 12 - 28   Sodium 145 (H) 134 - 144 mmol/L   Potassium 4.2 3.5 - 5.2 mmol/L   Chloride 100 96 - 106 mmol/L   CO2 27 20 - 29 mmol/L   Calcium 9.5 8.7 - 10.3 mg/dL       Pertinent labs & imaging results that were available during my care of the patient were reviewed by me and considered in my medical decision making.  Assessment & Plan:  Marcile was seen today for blood pressure has been elevated recently.  Diagnoses and all orders for this visit:  Essential hypertension BP log provided. Pt to bring BP machine by to have it checked. Elevated blood pressures readings in dentist office could be due to anxiety. Have dentist recheck BP after being in the exam room for at least 20 minutes. Report any high readings at home or new or worsening symptoms.     Continue all other maintenance medications.  Follow up plan: Return in about 3 months (around 08/02/2018), or if symptoms worsen or fail to improve.  Educational handout given for DASH diet, BP log  The above assessment and management plan was  discussed with the patient. The patient verbalized understanding of and has agreed to the management plan. Patient is aware to call the clinic if symptoms persist or worsen. Patient is aware when to return to the clinic for a follow-up visit. Patient educated on when it is appropriate to go to the emergency department.   Monia Pouch, FNP-C [facility] [phone]

## 2018-05-03 NOTE — Patient Instructions (Signed)
DASH Eating Plan  DASH stands for "Dietary Approaches to Stop Hypertension." The DASH eating plan is a healthy eating plan that has been shown to reduce high blood pressure (hypertension). It may also reduce your risk for type 2 diabetes, heart disease, and stroke. The DASH eating plan may also help with weight loss.  What are tips for following this plan?    General guidelines   Avoid eating more than 2,300 mg (milligrams) of salt (sodium) a day. If you have hypertension, you may need to reduce your sodium intake to 1,500 mg a day.   Limit alcohol intake to no more than 1 drink a day for nonpregnant women and 2 drinks a day for men. One drink equals 12 oz of beer, 5 oz of wine, or 1 oz of hard liquor.   Work with your health care provider to maintain a healthy body weight or to lose weight. Ask what an ideal weight is for you.   Get at least 30 minutes of exercise that causes your heart to beat faster (aerobic exercise) most days of the week. Activities may include walking, swimming, or biking.   Work with your health care provider or diet and nutrition specialist (dietitian) to adjust your eating plan to your individual calorie needs.  Reading food labels     Check food labels for the amount of sodium per serving. Choose foods with less than 5 percent of the Daily Value of sodium. Generally, foods with less than 300 mg of sodium per serving fit into this eating plan.   To find whole grains, look for the word "whole" as the first word in the ingredient list.  Shopping   Buy products labeled as "low-sodium" or "no salt added."   Buy fresh foods. Avoid canned foods and premade or frozen meals.  Cooking   Avoid adding salt when cooking. Use salt-free seasonings or herbs instead of table salt or sea salt. Check with your health care provider or pharmacist before using salt substitutes.   Do not fry foods. Cook foods using healthy methods such as baking, boiling, grilling, and broiling instead.   Cook with  heart-healthy oils, such as olive, canola, soybean, or sunflower oil.  Meal planning   Eat a balanced diet that includes:  ? 5 or more servings of fruits and vegetables each day. At each meal, try to fill half of your plate with fruits and vegetables.  ? Up to 6-8 servings of whole grains each day.  ? Less than 6 oz of lean meat, poultry, or fish each day. A 3-oz serving of meat is about the same size as a deck of cards. One egg equals 1 oz.  ? 2 servings of low-fat dairy each day.  ? A serving of nuts, seeds, or beans 5 times each week.  ? Heart-healthy fats. Healthy fats called Omega-3 fatty acids are found in foods such as flaxseeds and coldwater fish, like sardines, salmon, and mackerel.   Limit how much you eat of the following:  ? Canned or prepackaged foods.  ? Food that is high in trans fat, such as fried foods.  ? Food that is high in saturated fat, such as fatty meat.  ? Sweets, desserts, sugary drinks, and other foods with added sugar.  ? Full-fat dairy products.   Do not salt foods before eating.   Try to eat at least 2 vegetarian meals each week.   Eat more home-cooked food and less restaurant, buffet, and fast food.     When eating at a restaurant, ask that your food be prepared with less salt or no salt, if possible.  What foods are recommended?  The items listed may not be a complete list. Talk with your dietitian about what dietary choices are best for you.  Grains  Whole-grain or whole-wheat bread. Whole-grain or whole-wheat pasta. Brown rice. Oatmeal. Quinoa. Bulgur. Whole-grain and low-sodium cereals. Pita bread. Low-fat, low-sodium crackers. Whole-wheat flour tortillas.  Vegetables  Fresh or frozen vegetables (raw, steamed, roasted, or grilled). Low-sodium or reduced-sodium tomato and vegetable juice. Low-sodium or reduced-sodium tomato sauce and tomato paste. Low-sodium or reduced-sodium canned vegetables.  Fruits  All fresh, dried, or frozen fruit. Canned fruit in natural juice (without  added sugar).  Meat and other protein foods  Skinless chicken or turkey. Ground chicken or turkey. Pork with fat trimmed off. Fish and seafood. Egg whites. Dried beans, peas, or lentils. Unsalted nuts, nut butters, and seeds. Unsalted canned beans. Lean cuts of beef with fat trimmed off. Low-sodium, lean deli meat.  Dairy  Low-fat (1%) or fat-free (skim) milk. Fat-free, low-fat, or reduced-fat cheeses. Nonfat, low-sodium ricotta or cottage cheese. Low-fat or nonfat yogurt. Low-fat, low-sodium cheese.  Fats and oils  Soft margarine without trans fats. Vegetable oil. Low-fat, reduced-fat, or light mayonnaise and salad dressings (reduced-sodium). Canola, safflower, olive, soybean, and sunflower oils. Avocado.  Seasoning and other foods  Herbs. Spices. Seasoning mixes without salt. Unsalted popcorn and pretzels. Fat-free sweets.  What foods are not recommended?  The items listed may not be a complete list. Talk with your dietitian about what dietary choices are best for you.  Grains  Baked goods made with fat, such as croissants, muffins, or some breads. Dry pasta or rice meal packs.  Vegetables  Creamed or fried vegetables. Vegetables in a cheese sauce. Regular canned vegetables (not low-sodium or reduced-sodium). Regular canned tomato sauce and paste (not low-sodium or reduced-sodium). Regular tomato and vegetable juice (not low-sodium or reduced-sodium). Pickles. Olives.  Fruits  Canned fruit in a light or heavy syrup. Fried fruit. Fruit in cream or butter sauce.  Meat and other protein foods  Fatty cuts of meat. Ribs. Fried meat. Bacon. Sausage. Bologna and other processed lunch meats. Salami. Fatback. Hotdogs. Bratwurst. Salted nuts and seeds. Canned beans with added salt. Canned or smoked fish. Whole eggs or egg yolks. Chicken or turkey with skin.  Dairy  Whole or 2% milk, cream, and half-and-half. Whole or full-fat cream cheese. Whole-fat or sweetened yogurt. Full-fat cheese. Nondairy creamers. Whipped toppings.  Processed cheese and cheese spreads.  Fats and oils  Butter. Stick margarine. Lard. Shortening. Ghee. Bacon fat. Tropical oils, such as coconut, palm kernel, or palm oil.  Seasoning and other foods  Salted popcorn and pretzels. Onion salt, garlic salt, seasoned salt, table salt, and sea salt. Worcestershire sauce. Tartar sauce. Barbecue sauce. Teriyaki sauce. Soy sauce, including reduced-sodium. Steak sauce. Canned and packaged gravies. Fish sauce. Oyster sauce. Cocktail sauce. Horseradish that you find on the shelf. Ketchup. Mustard. Meat flavorings and tenderizers. Bouillon cubes. Hot sauce and Tabasco sauce. Premade or packaged marinades. Premade or packaged taco seasonings. Relishes. Regular salad dressings.  Where to find more information:   National Heart, Lung, and Blood Institute: www.nhlbi.nih.gov   American Heart Association: www.heart.org  Summary   The DASH eating plan is a healthy eating plan that has been shown to reduce high blood pressure (hypertension). It may also reduce your risk for type 2 diabetes, heart disease, and stroke.   With the   DASH eating plan, you should limit salt (sodium) intake to 2,300 mg a day. If you have hypertension, you may need to reduce your sodium intake to 1,500 mg a day.   When on the DASH eating plan, aim to eat more fresh fruits and vegetables, whole grains, lean proteins, low-fat dairy, and heart-healthy fats.   Work with your health care provider or diet and nutrition specialist (dietitian) to adjust your eating plan to your individual calorie needs.  This information is not intended to replace advice given to you by your health care provider. Make sure you discuss any questions you have with your health care provider.  Document Released: 03/10/2011 Document Revised: 03/14/2016 Document Reviewed: 03/14/2016  Elsevier Interactive Patient Education  2019 [employer]

## 2018-05-16 DIAGNOSIS — I10 Essential (primary) hypertension: Secondary | ICD-10-CM | POA: Diagnosis not present

## 2018-05-16 DIAGNOSIS — Z008 Encounter for other general examination: Secondary | ICD-10-CM | POA: Diagnosis not present

## 2018-05-16 DIAGNOSIS — E785 Hyperlipidemia, unspecified: Secondary | ICD-10-CM | POA: Diagnosis not present

## 2018-05-23 ENCOUNTER — Encounter: Payer: Self-pay | Admitting: *Deleted

## 2018-07-02 ENCOUNTER — Ambulatory Visit (INDEPENDENT_AMBULATORY_CARE_PROVIDER_SITE_OTHER): Payer: BLUE CROSS/BLUE SHIELD

## 2018-07-02 ENCOUNTER — Other Ambulatory Visit: Payer: Self-pay

## 2018-07-02 ENCOUNTER — Encounter: Payer: Self-pay | Admitting: Family Medicine

## 2018-07-02 ENCOUNTER — Ambulatory Visit: Payer: BLUE CROSS/BLUE SHIELD | Admitting: Family Medicine

## 2018-07-02 VITALS — BP 148/76 | HR 61 | Temp 98.0°F | Ht 64.0 in | Wt 219.0 lb

## 2018-07-02 DIAGNOSIS — M25511 Pain in right shoulder: Secondary | ICD-10-CM

## 2018-07-02 DIAGNOSIS — G8929 Other chronic pain: Secondary | ICD-10-CM

## 2018-07-02 DIAGNOSIS — I10 Essential (primary) hypertension: Secondary | ICD-10-CM

## 2018-07-02 MED ORDER — ATENOLOL 50 MG PO TABS: 50.0000 mg | ORAL_TABLET | Freq: Every day | ORAL | 2 refills | Status: DC

## 2018-07-02 MED ORDER — AMLODIPINE BESYLATE 5 MG PO TABS: 5.0000 mg | ORAL_TABLET | Freq: Every day | ORAL | 2 refills | Status: AC

## 2018-07-02 MED ORDER — DICLOFENAC SODIUM 1 % TD GEL: 2.0000 g | Freq: Four times a day (QID) | TRANSDERMAL | 3 refills | Status: AC

## 2018-07-02 MED ORDER — HYDROCHLOROTHIAZIDE 25 MG PO TABS: 25.0000 mg | ORAL_TABLET | Freq: Every day | ORAL | 3 refills | Status: AC

## 2018-07-02 MED ORDER — PREDNISONE 20 MG PO TABS: ORAL_TABLET | ORAL | 0 refills | Status: DC

## 2018-07-02 NOTE — Patient Instructions (Signed)
Shoulder Pain Many things can cause shoulder pain, including:  An injury.  Moving the shoulder in the same way again and again (overuse).  Joint pain (arthritis). Pain can come from:  Swelling and irritation (inflammation) of any part of the shoulder.  An injury to the shoulder joint.  An injury to: ? Tissues that connect muscle to bone (tendons). ? Tissues that connect bones to each other (ligaments). ? Bones. Follow these instructions at home: Watch for changes in your symptoms. Let your doctor know about them. Follow these instructions to help with your pain. If you have a sling:  Wear the sling as told by your doctor. Remove it only as told by your doctor.  Loosen the sling if your fingers: ? Tingle. ? Become numb. ? Turn cold and blue.  Keep the sling clean.  If the sling is not waterproof: ? Do not let it get wet. ? Take the sling off when you shower or bathe. Managing pain, stiffness, and swelling   If told, put ice on the painful area: ? Put ice in a plastic bag. ? Place a towel between your skin and the bag. ? Leave the ice on for 20 minutes, 2-3 times a day. Stop putting ice on if it does not help with the pain.  Squeeze a soft ball or a foam pad as much as possible. This prevents swelling in the shoulder. It also helps to strengthen the arm. General instructions  Take over-the-counter and prescription medicines only as told by your doctor.  Keep all follow-up visits as told by your doctor. This is important. Contact a doctor if:  Your pain gets worse.  Medicine does not help your pain.  You have new pain in your arm, hand, or fingers. Get help right away if:  Your arm, hand, or fingers: ? Tingle. ? Are numb. ? Are swollen. ? Are painful. ? Turn white or blue. Summary  Shoulder pain can be caused by many things. These include injury, moving the shoulder in the same away again and again, and joint pain.  Watch for changes in your symptoms.  Let your doctor know about them.  This condition may be treated with a sling, ice, and pain medicine.  Contact your doctor if the pain gets worse or you have new pain. Get help right away if your arm, hand, or fingers tingle or get numb, swollen, or painful.  Keep all follow-up visits as told by your doctor. This is important. This information is not intended to replace advice given to you by your health care provider. Make sure you discuss any questions you have with your health care provider. Document Released: 09/07/2007 Document Revised: 10/03/2017 Document Reviewed: 10/03/2017 Elsevier Interactive Patient Education  2019 Elsevier Inc.  

## 2018-07-02 NOTE — Progress Notes (Signed)
Subjective:  Patient ID: Carla Simmons, female    DOB: 03/13/1956, 63 y.o.   MRN: [id number]  Chief Complaint:  Right shoulder pain radiating into arm (ongoing for a few months, hurting underneath shoulder blade)   HPI: Carla Simmons is a 63 y.o. female presenting on 07/02/2018 for Right shoulder pain radiating into arm (ongoing for a few months, hurting underneath shoulder blade)   1. Chronic right shoulder pain  Pt presents today for ongoing and worsening right shoulder pain. She states this pain started 2-3 months ago and is worsening. She states the pain is located in the anterior and posterior shoulder and at times radiates down her arm. States the pain is worse in the mornings and after getting off of work. The pain is dull to sharp, 7/10 at worst. States the pain is aggravated by heavy lifting. She has not had a known injury. States she does lift overhead at work frequently. She is taking Mobic on a daily basis. She has tried BioFreeze with minimal relief of symptoms.   2. Essential hypertension  Complaint with meds - Yes Checking BP at home - No Exercising Regularly - No Watching Salt intake - No Pertinent ROS:  Headache - No Chest pain - No Dyspnea - No Palpitations - No LE edema - No They report good compliance with medications and can restate their regimen by memory. No medication side effects.  BP Readings from Last 3 Encounters:  07/02/18 (!) 148/76  05/03/18 135/76  04/18/18 138/73        Relevant past medical, surgical, family, and social history reviewed and updated as indicated.  Allergies and medications reviewed and updated.   Past Medical History:  Diagnosis Date  . Hyperlipidemia   . Hypertension     History reviewed. No pertinent surgical history.  Social History   Socioeconomic History  . Marital status: Married    Spouse name: Not on file  . Number of children: Not on file  . Years of education: Not on file  . Highest education  level: Not on file  Occupational History  . Not on file  Social Needs  . Financial resource strain: Not on file  . Food insecurity:    Worry: Not on file    Inability: Not on file  . Transportation needs:    Medical: Not on file    Non-medical: Not on file  Tobacco Use  . Smoking status: Former Smoker    Types: Cigarettes    Last attempt to quit: 12/22/2015    Years since quitting: 2.5  . Smokeless tobacco: Never Used  Substance and Sexual Activity  . Alcohol use: No  . Drug use: No  . Sexual activity: Not on file  Lifestyle  . Physical activity:    Days per week: Not on file    Minutes per session: Not on file  . Stress: Not on file  Relationships  . Social connections:    Talks on phone: Not on file    Gets together: Not on file    Attends religious service: Not on file    Active member of club or organization: Not on file    Attends meetings of clubs or organizations: Not on file    Relationship status: Not on file  . Intimate partner violence:    Fear of current or ex partner: Not on file    Emotionally abused: Not on file    Physically abused: Not on file  Forced sexual activity: Not on file  Other Topics Concern  . Not on file  Social History Narrative  . Not on file    Outpatient Encounter Medications as of 07/02/2018  Medication Sig  . amLODipine (NORVASC) 5 MG tablet Take 1 tablet (5 mg total) by mouth daily. (Needs to be seen before next refill)  . aspirin EC 81 MG tablet Take 81 mg by mouth daily.  Marland Kitchen atenolol (TENORMIN) 50 MG tablet Take 1 tablet (50 mg total) by mouth daily.  . Ferrous Sulfate (IRON) 325 (65 Fe) MG TABS Take 1 tablet (325 mg total) by mouth daily.  . Garlic 8786 MG CAPS Take 1,000 mg by mouth daily.  . hydrochlorothiazide (HYDRODIURIL) 25 MG tablet Take 1 tablet (25 mg total) by mouth daily.  . Magnesium 250 MG TABS Take by mouth.  . meloxicam (MOBIC) 15 MG tablet Take 1 tablet (15 mg total) by mouth daily.  . Multiple  Vitamins-Minerals (MULTIVITAMIN WITH MINERALS) tablet Take 1 tablet by mouth daily.  . Omega-3 Fatty Acids (FISH OIL) 1200 MG CAPS Take 1,200 mg by mouth daily.  Marland Kitchen omeprazole (PRILOSEC) 20 MG capsule TAKE 1 TABLET (20 MG TOTAL) BY MOUTH DAILY.  . simvastatin (ZOCOR) 20 MG tablet TAKE 1 TABLET BY MOUTH EVERY DAY  . [DISCONTINUED] amLODipine (NORVASC) 5 MG tablet Take 1 tablet (5 mg total) by mouth daily. (Needs to be seen before next refill)  . [DISCONTINUED] atenolol (TENORMIN) 50 MG tablet Take 1 tablet (50 mg total) by mouth daily.  . [DISCONTINUED] hydrochlorothiazide (HYDRODIURIL) 25 MG tablet Take 1 tablet (25 mg total) by mouth daily.  . diclofenac sodium (VOLTAREN) 1 % GEL Apply 2 g topically 4 (four) times daily.  . [DISCONTINUED] Na Sulfate-K Sulfate-Mg Sulf (SUPREP BOWEL PREP KIT) 17.5-3.13-1.6 GM/177ML SOLN Take 1 kit by mouth as directed.   No facility-administered encounter medications on file as of 07/02/2018.     Allergies  Allergen Reactions  . Crestor [Rosuvastatin]     Lightheadedness.    Review of Systems  Constitutional: Negative for chills, fatigue and fever.  Eyes: Negative for visual disturbance.  Respiratory: Negative for cough and shortness of breath.   Cardiovascular: Negative for chest pain, palpitations and leg swelling.  Musculoskeletal: Positive for arthralgias (right shoulder). Negative for back pain, gait problem, joint swelling, myalgias, neck pain and neck stiffness.  Skin: Negative for color change and wound.  Neurological: Negative for dizziness, tremors, seizures, facial asymmetry, speech difficulty, weakness, light-headedness, numbness and headaches.  Psychiatric/Behavioral: Negative for confusion.  All other systems reviewed and are negative.       Objective:  BP (!) 148/76   Pulse 61   Temp 98 F (36.7 C) (Oral)   Ht '5\' 4"'  (1.626 m)   Wt 219 lb (99.3 kg)   BMI 37.59 kg/m    Wt Readings from Last 3 Encounters:  07/02/18 219 lb (99.3  kg)  05/03/18 213 lb (96.6 kg)  04/18/18 223 lb (101.2 kg)    Physical Exam Vitals signs and nursing note reviewed.  Constitutional:      General: She is not in acute distress.    Appearance: Normal appearance. She is obese. She is not ill-appearing or toxic-appearing.  HENT:     Head: Normocephalic and atraumatic.     Mouth/Throat:     Mouth: Mucous membranes are moist.  Eyes:     Extraocular Movements: Extraocular movements intact.     Conjunctiva/sclera: Conjunctivae normal.     Pupils: Pupils  are equal, round, and reactive to light.  Neck:     Musculoskeletal: Normal range of motion and neck supple. No neck rigidity or muscular tenderness.     Vascular: No carotid bruit.  Cardiovascular:     Rate and Rhythm: Normal rate and regular rhythm.     Heart sounds: Normal heart sounds. No murmur. No friction rub. No gallop.   Pulmonary:     Effort: Pulmonary effort is normal. No respiratory distress.     Breath sounds: Normal breath sounds.  Musculoskeletal:     Right shoulder: She exhibits tenderness and pain (with internal rotation and abduction. Negative drop arm sing. Positive Neer's sign.). She exhibits normal range of motion, no bony tenderness, no swelling, no effusion, no crepitus, no deformity, no laceration, no spasm, normal pulse and normal strength.     Right elbow: Normal.    Cervical back: Normal.     Comments: Grips equal  Lymphadenopathy:     Cervical: No cervical adenopathy.  Skin:    General: Skin is warm and dry.     Capillary Refill: Capillary refill takes less than 2 seconds.  Neurological:     General: No focal deficit present.     Mental Status: She is alert and oriented to person, place, and time.     Cranial Nerves: No cranial nerve deficit.     Sensory: No sensory deficit.     Motor: No weakness.     Coordination: Coordination normal.     Gait: Gait normal.     Deep Tendon Reflexes: Reflexes normal.  Psychiatric:        Mood and Affect: Mood  normal.        Behavior: Behavior normal. Behavior is cooperative.        Thought Content: Thought content normal.        Judgment: Judgment normal.     Results for orders placed or performed in visit on 04/18/18  Bayer DCA Hb A1c Waived  Result Value Ref Range   HB A1C (BAYER DCA - WAIVED) 6.0 <7.0 %  BMP8+EGFR  Result Value Ref Range   Glucose 96 65 - 99 mg/dL   BUN 18 8 - 27 mg/dL   Creatinine, Ser 0.72 0.57 - 1.00 mg/dL   GFR calc non Af Amer 89 >59 mL/min/1.73   GFR calc Af Amer 103 >59 mL/min/1.73   BUN/Creatinine Ratio 25 12 - 28   Sodium 145 (H) 134 - 144 mmol/L   Potassium 4.2 3.5 - 5.2 mmol/L   Chloride 100 96 - 106 mmol/L   CO2 27 20 - 29 mmol/L   Calcium 9.5 8.7 - 10.3 mg/dL     X-Ray: Right shoulder: degenerative changes, no acute findings. Preliminary x-ray reading by Monia Pouch, FNP-C, [facility].   Pertinent labs & imaging results that were available during my care of the patient were reviewed by me and considered in my medical decision making.  Assessment & Plan:  Aundraya was seen today for right shoulder pain radiating into arm.  Diagnoses and all orders for this visit:  Chronic right shoulder pain No acute findings on xray, will notify if radiology reading differs. Will trial Voltaren gel. Referral to PT. Report any new or worsening symptoms.  -     DG Shoulder Right; Future -     Ambulatory referral to Physical Therapy -     diclofenac sodium (VOLTAREN) 1 % GEL; Apply 2 g topically 4 (four) times daily.  Essential hypertension Stable, continue below.  Return in 3 months.  -     hydrochlorothiazide (HYDRODIURIL) 25 MG tablet; Take 1 tablet (25 mg total) by mouth daily. -     amLODipine (NORVASC) 5 MG tablet; Take 1 tablet (5 mg total) by mouth daily. (Needs to be seen before next refill) -     atenolol (TENORMIN) 50 MG tablet; Take 1 tablet (50 mg total) by mouth daily.     Continue all other maintenance medications.  Follow up plan: Return in about 6  weeks (around 08/13/2018), or if symptoms worsen or fail to improve, for shoulder.  Educational handout given for shoulder pain  The above assessment and management plan was discussed with the patient. The patient verbalized understanding of and has agreed to the management plan. Patient is aware to call the clinic if symptoms persist or worsen. Patient is aware when to return to the clinic for a follow-up visit. Patient educated on when it is appropriate to go to the emergency department.   Monia Pouch, FNP-C [facility] [phone]

## 2018-07-04 ENCOUNTER — Telehealth: Payer: Self-pay | Admitting: Family Medicine

## 2018-07-04 DIAGNOSIS — M25511 Pain in right shoulder: Principal | ICD-10-CM

## 2018-07-04 DIAGNOSIS — G8929 Other chronic pain: Secondary | ICD-10-CM

## 2018-07-04 NOTE — Telephone Encounter (Signed)
Pt states she got the prednisone dose pack yesterday and started taking it and has been using heat, will try ice to see if that helps. Pt states she is still having quite a bit of pain and is aware of feedback from x-ray report and that you will be back in office tomorrow. Should we do ortho referral ?

## 2018-07-04 NOTE — Telephone Encounter (Signed)
Yes, we can do ortho referral. Thanks.

## 2018-07-05 NOTE — Telephone Encounter (Signed)
Ortho referral placed and pt is aware 

## 2018-07-07 ENCOUNTER — Other Ambulatory Visit: Payer: Self-pay | Admitting: Family

## 2018-07-07 DIAGNOSIS — M25511 Pain in right shoulder: Principal | ICD-10-CM

## 2018-07-07 DIAGNOSIS — G8929 Other chronic pain: Secondary | ICD-10-CM

## 2018-07-07 MED ORDER — PREDNISONE 20 MG PO TABS: ORAL_TABLET | ORAL | 0 refills | Status: AC

## 2018-07-07 NOTE — Progress Notes (Signed)
   Virtual Visit via telephone Note  I connected with Carla Simmons on 07/07/18 at 9:22 AM by telephone and verified that I am speaking with the correct person using two identifiers. Carla Simmons is currently located at home and husband is currently with her during visit. The provider, Jannifer Rodney, FNP is located in their office at time of visit.  I discussed the limitations, risks, security and privacy concerns of performing an evaluation and management service by telephone and the availability of in person appointments. I also discussed with the patient that there may be a patient responsible charge related to this service. The patient expressed understanding and agreed to proceed.   History and Present Illness:  Pt calls today with recurrent right shoulder pain. She was seen on 07/02/18 and given prednisone. She states this greatly helped and was able to work. She has Ortho appt 07/12/18 and was hoping to get more prednisone to make it to her Ortho appt so she  Could continue to work.   Shoulder Pain   The pain is present in the right shoulder. This is a new problem. The current episode started more than 1 month ago. There has been no history of extremity trauma. The problem occurs intermittently. The problem has been unchanged. The quality of the pain is described as aching. The pain is at a severity of 7/10. The pain is moderate. Associated symptoms include a limited range of motion. Pertinent negatives include no inability to bear weight, numbness or stiffness. She has tried acetaminophen and rest (prednisone) for the symptoms. The treatment provided mild relief.      Review of Systems  Musculoskeletal: Negative for stiffness.  Neurological: Negative for numbness.  All other systems reviewed and are negative.    Observations/Objective: No SOB or distress noted  Assessment and Plan: 1. Chronic right shoulder pain Continue Mobic Keep Ortho appt ROM exercises encouraged  Call office if pain worsens or does not improve  - predniSONE (DELTASONE) 20 MG tablet; 2 po at sametime daily for 5 days  Dispense: 10 tablet; Refill: 0     I discussed the assessment and treatment plan with the patient. The patient was provided an opportunity to ask questions and all were answered. The patient agreed with the plan and demonstrated an understanding of the instructions.   The patient was advised to call back or seek an in-person evaluation if the symptoms worsen or if the condition fails to improve as anticipated.  The above assessment and management plan was discussed with the patient. The patient verbalized understanding of and has agreed to the management plan. Patient is aware to call the clinic if symptoms persist or worsen. Patient is aware when to return to the clinic for a follow-up visit. Patient educated on when it is appropriate to go to the emergency department.    Call ended 9:32 AM, I provided 10 minutes of non-face-to-face time during this encounter.    Jannifer Rodney, FNP

## 2018-07-11 ENCOUNTER — Telehealth (INDEPENDENT_AMBULATORY_CARE_PROVIDER_SITE_OTHER): Payer: Self-pay | Admitting: Radiology

## 2018-07-11 NOTE — Telephone Encounter (Signed)
I called patient to confirm appointment for 07/12/2018. Patient answered "No" to all COVID-19 screening questions.

## 2018-07-12 ENCOUNTER — Other Ambulatory Visit: Payer: Self-pay

## 2018-07-12 ENCOUNTER — Ambulatory Visit (INDEPENDENT_AMBULATORY_CARE_PROVIDER_SITE_OTHER): Payer: BLUE CROSS/BLUE SHIELD | Admitting: Orthopaedic Surgery

## 2018-07-12 ENCOUNTER — Ambulatory Visit (INDEPENDENT_AMBULATORY_CARE_PROVIDER_SITE_OTHER): Payer: Self-pay

## 2018-07-12 ENCOUNTER — Encounter (INDEPENDENT_AMBULATORY_CARE_PROVIDER_SITE_OTHER): Payer: Self-pay | Admitting: Orthopaedic Surgery

## 2018-07-12 VITALS — Ht 63.0 in | Wt 220.0 lb

## 2018-07-12 DIAGNOSIS — M7531 Calcific tendinitis of right shoulder: Secondary | ICD-10-CM | POA: Diagnosis not present

## 2018-07-12 DIAGNOSIS — M47812 Spondylosis without myelopathy or radiculopathy, cervical region: Secondary | ICD-10-CM | POA: Diagnosis not present

## 2018-07-12 DIAGNOSIS — M542 Cervicalgia: Secondary | ICD-10-CM

## 2018-07-12 NOTE — Addendum Note (Signed)
Addended by: Rogers Seeds on: 07/12/2018 12:56 PM   Modules accepted: Orders

## 2018-07-12 NOTE — Progress Notes (Signed)
Office Visit Note   Patient: Carla Simmons           Date of Birth: 14-Mar-1956           MRN: [id number] Visit Date: 07/12/2018              Requested by: Sonny Masters, FNP [address] [place], [place] [address] PCP: Sonny Masters, FNP   Assessment & Plan: Visit Diagnoses:  1. Neck pain   2. Arthritis of facet joint of cervical spine   3. Calcific tendonitis of right shoulder     Plan: Patient's pain appears to be coming from her cervical spine with brachial plexus tenderness and positive tension signs.  We will set her up for some physical therapy.  She does have a degenerative acromioclavicular joint with prominent spurring but this does not seem to be symptomatic on physical exam.  We will set up for some physical therapy cervical spine recheck in 5 weeks if she is having persistent problems will consider cervical MRI imaging.  She likely has some foraminal stenosis with recent disc bulge which is causing her symptoms.  Thank you the opportunity to see her in consultation.  Follow-Up Instructions: Return in about 5 weeks (around 08/16/2018).   Orders:  Orders Placed This Encounter  Procedures   XR Cervical Spine 2 or 3 views   No orders of the defined types were placed in this encounter.     Procedures: No procedures performed   Clinical Data: No additional findings.   Subjective: Chief Complaint  Patient presents with   Right Shoulder - Pain    HPI 63 year old female here for consultation from [facility] with problems with right shoulder pain and right arm pain.  She has had 2 prednisone packs she has had pain with use of her arm.  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  She is noticed popping in her shoulder occasionally.  No history of rheumatologic disease.  She did have congenital left foot deformity with shortening third toe some problems running and  occasional cramping in her left lower extremity.  Left long problem of prolonged walking she rode the bus when she was younger.  Denies bowel bladder symptoms no falling.  No symptoms in the left upper extremity.  She is right-hand dominant.  Review of Systems past smoker quit 5 years ago.  Positive for hypertension hyperlipidemia, GERD, history of PVCs prediabetic.  BMI 38.   Objective: Vital Signs: Ht 5\' 3"  (1.6 m)    Wt 220 lb (99.8 kg)    BMI 38.97 kg/m   Physical Exam Constitutional:      Appearance: She is well-developed.  HENT:     Head: Normocephalic.     Right Ear: External ear normal.     Left Ear: External ear normal.  Eyes:     Pupils: Pupils are equal, round, and reactive to light.  Neck:     Thyroid: No thyromegaly.     Trachea: No tracheal deviation.  Cardiovascular:     Rate and Rhythm: Normal rate.  Pulmonary:     Effort: Pulmonary effort is normal.  Abdominal:     Palpations: Abdomen is soft.  Skin:    General: Skin is warm and dry.  Neurological:     Mental Status: She is alert and oriented to person, place, and time.  Psychiatric:  Behavior: Behavior normal.     Ortho Exam patient is intact upper extremity she can get her arm up overhead minimal discomfort with crossarm adduction.  Long of the biceps right shoulder is nontender negative drop arm test right arm.  Exquisite right brachial plexus tenderness.  Positive Spurling on the right negative on the left.  Some increased discomfort with cervical compression.  Upper extremity reflexes are symmetrical no thenar atrophy biceps triceps is strong.  Prominence of the distal clavicle with prominent spur which is mildly tender.  Patient has a congenital short left third toe.  Third metatarsal appears to be present with palpation.  Mild limitation of ankle dorsiflexion plantarflexion she is amatory with a heel toe gait slight decreased range of motion left ankle versus right.  Good capillary refill of foot  normal pulses.  Negative myelopathic gait. Specialty Comments:  No specialty comments available.  Imaging: Xr Cervical Spine 2 Or 3 Views  Result Date: 07/12/2018 AP lateral cervical spine x-rays are obtained and reviewed.  This shows facet degenerative changes worse on the left than right with maintenance of disc space height and no listhesis. Impression: Cervical facet degenerative changes more noticeable on the left than right.    PMFS History: Patient Active Problem List   Diagnosis Date Noted   Arthritis of facet joint of cervical spine 07/12/2018   Calcific tendonitis of right shoulder 07/12/2018   Pre-diabetes 12/23/2016   Osteopenia 09/11/2014   Palpitations 06/28/2013   PVC's (premature ventricular contractions) 06/28/2013   HTN (hypertension) 04/16/2013   HLD (hyperlipidemia) 04/16/2013   GERD (gastroesophageal reflux disease) 04/16/2013   Past Medical History:  Diagnosis Date   Hyperlipidemia    Hypertension     Family History  Problem Relation Age of Onset   Diabetes Mother    Heart disease Father     No past surgical history on file. Social History   Occupational History   Not on file  Tobacco Use   Smoking status: Former Smoker    Types: Cigarettes    Last attempt to quit: 12/22/2015    Years since quitting: 2.5   Smokeless tobacco: Never Used  Substance and Sexual Activity   Alcohol use: No   Drug use: No   Sexual activity: Not on file

## 2018-07-16 ENCOUNTER — Ambulatory Visit: Payer: BLUE CROSS/BLUE SHIELD | Attending: Orthopaedic Surgery | Admitting: Physical Therapy

## 2018-07-16 ENCOUNTER — Other Ambulatory Visit: Payer: Self-pay

## 2018-07-16 DIAGNOSIS — M542 Cervicalgia: Secondary | ICD-10-CM | POA: Insufficient documentation

## 2018-07-16 DIAGNOSIS — R293 Abnormal posture: Secondary | ICD-10-CM | POA: Insufficient documentation

## 2018-07-16 NOTE — Therapy (Addendum)
[facility] [address] [place], [place], [address] Phone: [phone]   Fax:  [phone]  Physical Therapy Evaluation  Patient Details  Name: Carla Simmons MRN: [id number] Date of Birth: 1955-07-25 Referring Provider (PT): Annell Greening MD.   Encounter Date: 07/16/2018  PT End of Session - 07/16/18 1230    Visit Number  1    Number of Visits  4    Date for PT Re-Evaluation  08/13/18    PT Start Time  1107    PT Stop Time  1154    PT Time Calculation (min)  47 min    Activity Tolerance  Patient tolerated treatment well    Behavior During Therapy  [facility] for tasks assessed/performed       Past Medical History:  Diagnosis Date  . Hyperlipidemia   . Hypertension     No past surgical history on file.  There were no vitals filed for this visit.   Subjective Assessment - 07/16/18 1134    Subjective  COVID-19  screen performed prior to patient entering clinic.  The patient presents to the clinic today with c/o right sided neck pain with referral to right elbow.  This has been worsenening since January of this year.  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  Her pain at rest today is a 3/10 but after a long work shift can rise to 7 to 7+/10.  Heat and Tylenol decrease her pain.    Pertinent History  HTN.    Diagnostic tests  X-ray.    Patient Stated Goals  Get out of pain.    Currently in Pain?  Yes    Pain Score  3     Pain Location  Neck    Pain Orientation  Right    Pain Descriptors / Indicators  Aching    Pain Type  Acute pain    Pain Radiating Towards  Right UE.    Pain Onset  More than a month ago    Pain Frequency  Constant    Aggravating Factors   See above.         [facility] PT Assessment - 07/16/18 0001      Assessment   Medical Diagnosis  Neck pain.    Referring Provider (PT)  Annell Greening MD.    Onset Date/Surgical Date  --   January 2020.     Precautions    Precautions  None      Restrictions   Weight Bearing Restrictions  No      Balance Screen   Has the patient fallen in the past 6 months  No    Has the patient had a decrease in activity level because of a fear of falling?   No    Is the patient reluctant to leave their home because of a fear of falling?   No      Home Environment   Living Environment  Private residence      Prior Function   Level of Independence  Independent      Posture/Postural Control   Posture/Postural Control  Postural limitations    Postural Limitations  Rounded Shoulders;Forward head      ROM / Strength   AROM / PROM / Strength  AROM;Strength      AROM   Overall AROM Comments  Right active cervical rotation= 72 degress, left= 55 degrees, right SB= 17 degrees and left= 12 degrees.  Strength   Overall Strength Comments  Normal bilateral UE strength.  Right grip (dominant side)= 66# and left= 55#.      Palpation   Palpation comment  Tender to palpation at right C5 facet joint region.      Special Tests   Other special tests  Diminished UE DTR's bilaterally.      Ambulation/Gait   Gait Comments  WNL.                Objective measurements completed on examination: See above findings.      [facility] Adult PT Treatment/Exercise - 07/16/18 0001      Modalities   Modalities  Traction      Traction   Type of Traction  Cervical    Min (lbs)  5    Max (lbs)  15    Hold Time  99    Rest Time  5    Time  15             PT Education - 07/16/18 1148    Education Details  Chin tucks and cervical extension.    Person(s) Educated  Patient    Methods  Explanation;Demonstration    Comprehension  Verbalized understanding;Returned demonstration          PT Long Term Goals - 07/16/18 1228      PT LONG TERM GOAL #1   Title  Independent with a HEP.    Time  4    Period  Weeks    Status  New      PT LONG TERM GOAL #2   Title  Increase active cervical left rotation to 70  degrees+ so patient can turn head more easily while driving.    Time  4    Period  Weeks    Status  New      PT LONG TERM GOAL #3   Title  Eliminate UE symptoms.    Time  4    Period  Weeks    Status  New             Plan - 07/16/18 1210    Clinical Impression Statement  The patient presents to OPPT with c/o neck pain that began in January of this year.  She reports pain travels into her right UE, especially over her Bicep muscle.  She has limited cervical motion and is quite tender to palpation at the C5 region of her neck.  Her UE strength is normal.  Her bilateral UE DTR's are diminished.  Patient will benefit from skilled physical therapy intervention to address deficits and pain.    Examination-Activity Limitations  Other    Examination-Participation Restrictions  Yard Work    Stability/Clinical Decision Making  Evolving/Moderate complexity    Clinical Decision Making  Low    PT Frequency  1x / week    PT Duration  4 weeks    PT Treatment/Interventions  ADLs/Self Care Home Management;Electrical Stimulation;Ultrasound;Traction;Moist Heat;Therapeutic exercise;Patient/family education;Manual techniques;Passive range of motion;Spinal Manipulations    PT Next Visit Plan  Int cervical traction at 17# with max at 23-25# per patient tolerance, postural exercises, Combo e'stim/U/S f/b STW/M to right cervical region.    Consulted and Agree with Plan of Care  Patient       Patient will benefit from skilled therapeutic intervention in order to improve the following deficits and impairments:  Pain, Postural dysfunction, Decreased range of motion  Visit Diagnosis: Cervicalgia - Plan: PT plan of care cert/re-cert  Abnormal  posture - Plan: PT plan of care cert/re-cert     Problem List Patient Active Problem List   Diagnosis Date Noted  . Arthritis of facet joint of cervical spine 07/12/2018  . Calcific tendonitis of right shoulder 07/12/2018  . Pre-diabetes 12/23/2016  .  Osteopenia 09/11/2014  . Palpitations 06/28/2013  . PVC's (premature ventricular contractions) 06/28/2013  . HTN (hypertension) 04/16/2013  . HLD (hyperlipidemia) 04/16/2013  . GERD (gastroesophageal reflux disease) 04/16/2013    Cing [place], [place] MPT 07/16/2018, 12:46 PM  [facility] [address] [place], [place], [address] Phone: [phone]   Fax:  [phone]  Name: Miah Breton MRN: [id number] Date of Birth: 1955-05-02

## 2018-07-26 ENCOUNTER — Ambulatory Visit: Payer: BLUE CROSS/BLUE SHIELD | Admitting: Physical Therapy

## 2018-07-26 ENCOUNTER — Encounter: Payer: Self-pay | Admitting: Physical Therapy

## 2018-07-26 ENCOUNTER — Other Ambulatory Visit: Payer: Self-pay

## 2018-07-26 DIAGNOSIS — R293 Abnormal posture: Secondary | ICD-10-CM | POA: Diagnosis not present

## 2018-07-26 DIAGNOSIS — M542 Cervicalgia: Secondary | ICD-10-CM

## 2018-07-26 NOTE — Therapy (Addendum)
[facility] [place], [place], [address] Phone: [phone]   Fax:  [phone]  Physical Therapy Treatment PHYSICAL THERAPY DISCHARGE SUMMARY  Visits from Start of Care: 2  Current functional level related to goals / functional outcomes: See below   Remaining deficits: See goals   Education / Equipment: HEP Plan: Patient agrees to discharge.  Patient goals were not met. Patient is being discharged due to not returning since the last visit.  ?????    Gabriela Eves, PT, DPT 05/23/19  Patient Details  Name: Carla Simmons MRN: [id number] Date of Birth: 19-May-1955 Referring Provider (PT): Rodell Perna MD.   Encounter Date: 07/26/2018  PT End of Session - 07/26/18 1158    Visit Number  2    Number of Visits  4    Date for PT Re-Evaluation  08/13/18    PT Start Time  2778    PT Stop Time  1212    PT Time Calculation (min)  56 min    Activity Tolerance  Patient tolerated treatment well    Behavior During Therapy  [facility] for tasks assessed/performed       Past Medical History:  Diagnosis Date  . Hyperlipidemia   . Hypertension     History reviewed. No pertinent surgical history.  There were no vitals filed for this visit.  Subjective Assessment - 07/26/18 1216    Subjective  COVID-19  screen performed prior to patient entering clinic. Patient reported 5/10 pain. Patient states she was a little sore after traction.     Pertinent History  HTN.    Diagnostic tests  X-ray.    Patient Stated Goals  Get out of pain.    Currently in Pain?  Yes    Pain Score  5     Pain Location  Neck    Pain Orientation  Right    Pain Descriptors / Indicators  Aching    Pain Type  Acute pain    Pain Onset  More than a month ago    Pain Frequency  Constant         [facility] PT Assessment - 07/26/18 0001      Assessment   Medical Diagnosis  Neck pain.                   [facility] Adult PT Treatment/Exercise -  07/26/18 0001      Exercises   Exercises  Neck      Neck Exercises: Seated   Other Seated Exercise  chin tucks x10 with cuing for form      Modalities   Modalities  Traction;Ultrasound      Ultrasound   Ultrasound Location  right UT    Ultrasound Parameters  Combo 100%, 1.5 w/cm2, x12 minutes      Traction   Type of Traction  Cervical    Min (lbs)  5    Max (lbs)  15    Hold Time  99    Rest Time  5    Time  15      Manual Therapy   Manual Therapy  Soft tissue mobilization    Soft tissue mobilization  STW/M to cervical paraspinals and right UT, right biceps STW/M.                  PT Long Term Goals - 07/16/18 1228      PT LONG TERM GOAL #1   Title  Independent with a HEP.  Time  4    Period  Weeks    Status  New      PT LONG TERM GOAL #2   Title  Increase active cervical left rotation to 70 degrees+ so patient can turn head more easily while driving.    Time  4    Period  Weeks    Status  New      PT LONG TERM GOAL #3   Title  Eliminate UE symptoms.    Time  4    Period  Weeks    Status  New            Plan - 07/26/18 1200    Clinical Impression Statement  Patient was able to tolerate treatment well. Patient denied pain with Combo US/e-stim but did note increased pain to right biceps with STW/M with moderate pressure. Patient and PT reviewed chin tucks as patient performed more cervical flexion than a chin tuck. Patient demonstrated improved form after cuing. Patient's traction increased to 17# today with complaints of pain upon removal.    Examination-Activity Limitations  Other    Examination-Participation Restrictions  Yard Work    Stability/Clinical Decision Making  Evolving/Moderate complexity    Clinical Decision Making  Low    PT Frequency  1x / week    PT Duration  4 weeks    PT Treatment/Interventions  ADLs/Self Care Home Management;Electrical Stimulation;Ultrasound;Traction;Moist Heat;Therapeutic exercise;Patient/family  education;Manual techniques;Passive range of motion;Spinal Manipulations    PT Next Visit Plan  Int cervical traction at 17# with max at 23-25# per patient tolerance, postural exercises, Combo e'stim/U/S f/b STW/M to right cervical region.    Consulted and Agree with Plan of Care  Patient       Patient will benefit from skilled therapeutic intervention in order to improve the following deficits and impairments:  Pain, Postural dysfunction, Decreased range of motion  Visit Diagnosis: Cervicalgia  Abnormal posture     Problem List Patient Active Problem List   Diagnosis Date Noted  . Arthritis of facet joint of cervical spine 07/12/2018  . Calcific tendonitis of right shoulder 07/12/2018  . Pre-diabetes 12/23/2016  . Osteopenia 09/11/2014  . Palpitations 06/28/2013  . PVC's (premature ventricular contractions) 06/28/2013  . HTN (hypertension) 04/16/2013  . HLD (hyperlipidemia) 04/16/2013  . GERD (gastroesophageal reflux disease) 04/16/2013   Gabriela Eves, PT, DPT 07/26/2018, 12:23 PM  [facility] [address] [place], [place], [address] Phone: [phone]   Fax:  [phone]  Name: Carla Simmons MRN: [id number] Date of Birth: 1956-01-02

## 2018-07-30 ENCOUNTER — Encounter: Payer: BLUE CROSS/BLUE SHIELD | Admitting: Family Medicine

## 2018-07-31 ENCOUNTER — Encounter: Payer: BLUE CROSS/BLUE SHIELD | Admitting: Physical Therapy

## 2018-07-31 ENCOUNTER — Encounter: Payer: BLUE CROSS/BLUE SHIELD | Admitting: Family Medicine

## 2018-08-14 ENCOUNTER — Ambulatory Visit: Payer: BLUE CROSS/BLUE SHIELD | Admitting: Family Medicine

## 2018-08-14 ENCOUNTER — Telehealth: Payer: Self-pay | Admitting: *Deleted

## 2018-08-14 NOTE — Telephone Encounter (Signed)
Left message for patient to call back to set up follow up appt with Kari Baars, FNP

## 2018-12-13 DIAGNOSIS — E785 Hyperlipidemia, unspecified: Secondary | ICD-10-CM | POA: Diagnosis not present

## 2018-12-13 DIAGNOSIS — I10 Essential (primary) hypertension: Secondary | ICD-10-CM | POA: Diagnosis not present

## 2018-12-13 DIAGNOSIS — S46911A Strain of unspecified muscle, fascia and tendon at shoulder and upper arm level, right arm, initial encounter: Secondary | ICD-10-CM | POA: Diagnosis not present

## 2018-12-27 DIAGNOSIS — I10 Essential (primary) hypertension: Secondary | ICD-10-CM | POA: Diagnosis not present

## 2018-12-27 DIAGNOSIS — Z23 Encounter for immunization: Secondary | ICD-10-CM | POA: Diagnosis not present

## 2018-12-27 DIAGNOSIS — Z1211 Encounter for screening for malignant neoplasm of colon: Secondary | ICD-10-CM | POA: Diagnosis not present

## 2019-01-28 ENCOUNTER — Other Ambulatory Visit: Payer: Self-pay | Admitting: *Deleted

## 2019-01-28 DIAGNOSIS — M1711 Unilateral primary osteoarthritis, right knee: Secondary | ICD-10-CM

## 2019-01-28 MED ORDER — MELOXICAM 15 MG PO TABS: 15.0000 mg | ORAL_TABLET | Freq: Every day | ORAL | 0 refills | Status: AC

## 2019-01-30 ENCOUNTER — Other Ambulatory Visit: Payer: Self-pay | Admitting: *Deleted

## 2019-01-30 DIAGNOSIS — E785 Hyperlipidemia, unspecified: Secondary | ICD-10-CM

## 2019-01-30 MED ORDER — SIMVASTATIN 20 MG PO TABS: ORAL_TABLET | ORAL | 0 refills | Status: AC

## 2019-03-05 ENCOUNTER — Other Ambulatory Visit: Payer: Self-pay | Admitting: Family Medicine

## 2019-03-05 DIAGNOSIS — E785 Hyperlipidemia, unspecified: Secondary | ICD-10-CM

## 2019-03-06 NOTE — Telephone Encounter (Signed)
Rakes. NTBS 30 days given 01/30/19

## 2019-04-03 ENCOUNTER — Other Ambulatory Visit: Payer: Self-pay | Admitting: Family Medicine

## 2019-04-03 DIAGNOSIS — E785 Hyperlipidemia, unspecified: Secondary | ICD-10-CM

## 2019-04-03 DIAGNOSIS — M1711 Unilateral primary osteoarthritis, right knee: Secondary | ICD-10-CM

## 2019-04-04 DIAGNOSIS — M179 Osteoarthritis of knee, unspecified: Secondary | ICD-10-CM | POA: Diagnosis not present

## 2019-04-04 DIAGNOSIS — E785 Hyperlipidemia, unspecified: Secondary | ICD-10-CM | POA: Diagnosis not present

## 2019-04-04 DIAGNOSIS — I10 Essential (primary) hypertension: Secondary | ICD-10-CM | POA: Diagnosis not present

## 2019-05-23 IMAGING — CT CT RENAL STONE PROTOCOL
2 of 4 series · 16 of 46 positions shown, 18 images · non-contrast
Comparison: CT abdomen pelvis 02/12/2012

CLINICAL DATA: Right flank pain.

EXAM:
CT ABDOMEN AND PELVIS WITHOUT CONTRAST
TECHNIQUE: Multidetector CT imaging of the abdomen and pelvis was performed
following the standard protocol without IV contrast.

[Series 2: axial st · axial · 0.86mm/px · z∈[-503,-98]mm · 13 of 89 slices shown, 15 images]
[im 4/89  soft-tissue]
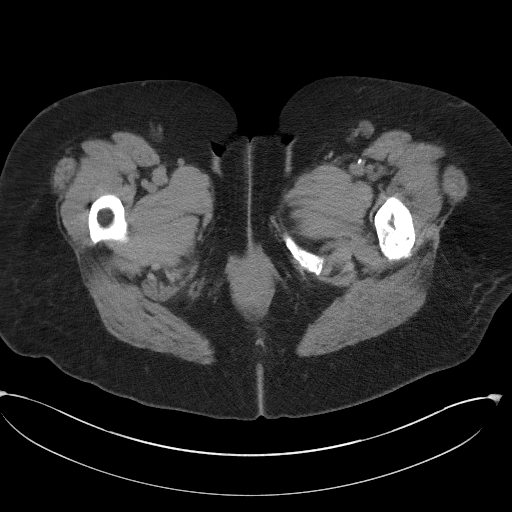
[im 4/89  bone]
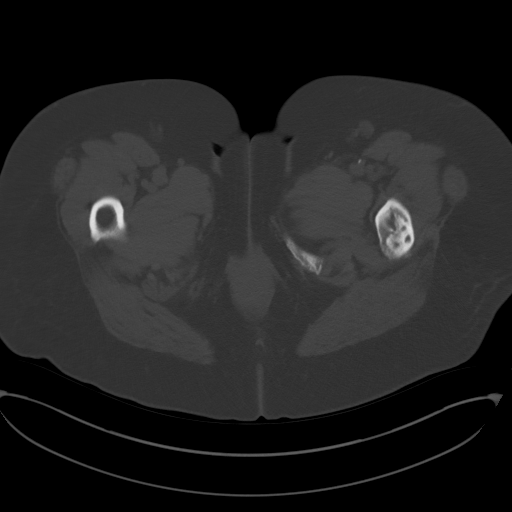
[im 12/89  soft-tissue]
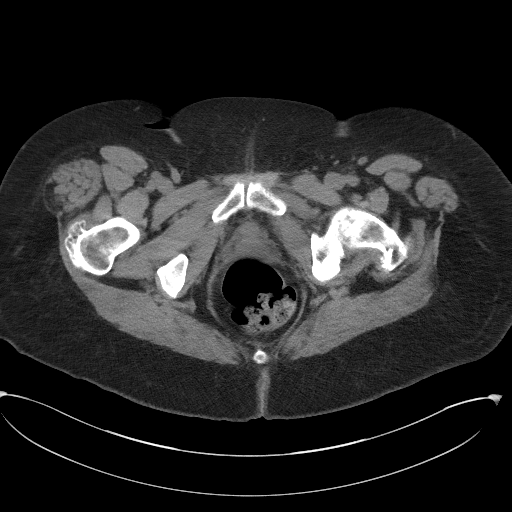
[im 19/89  soft-tissue]
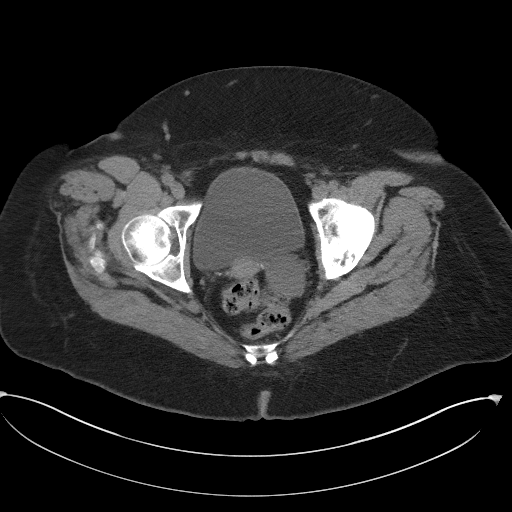
[im 26/89  soft-tissue]
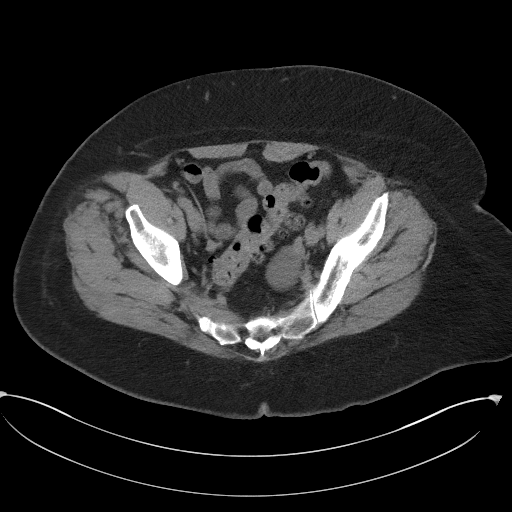
[im 30/89  soft-tissue]
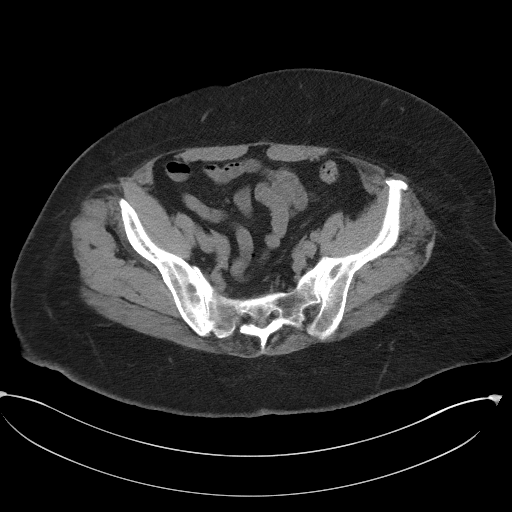
[im 37/89  soft-tissue]
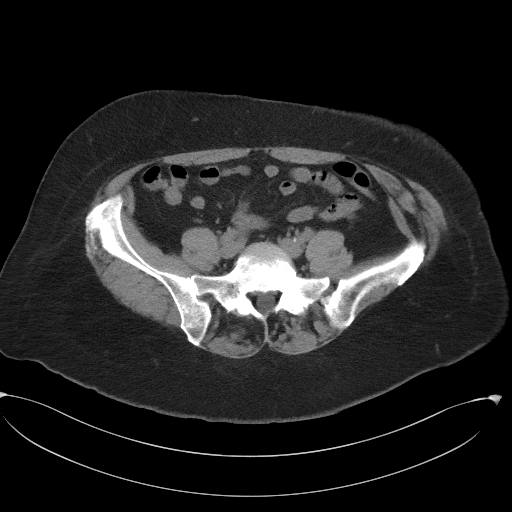
[im 45/89  soft-tissue]
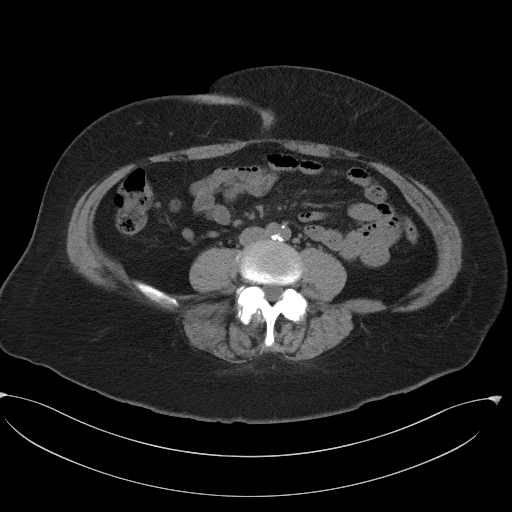
[im 52/89  soft-tissue]
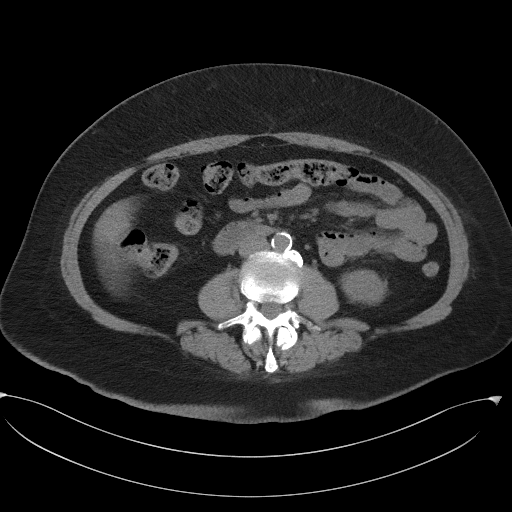
[im 59/89  soft-tissue]
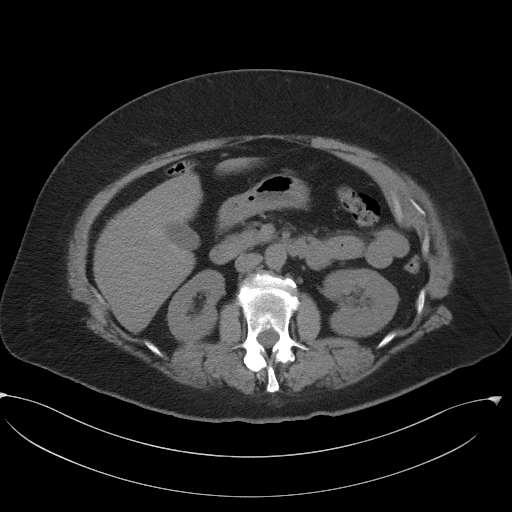
[im 59/89  bone]
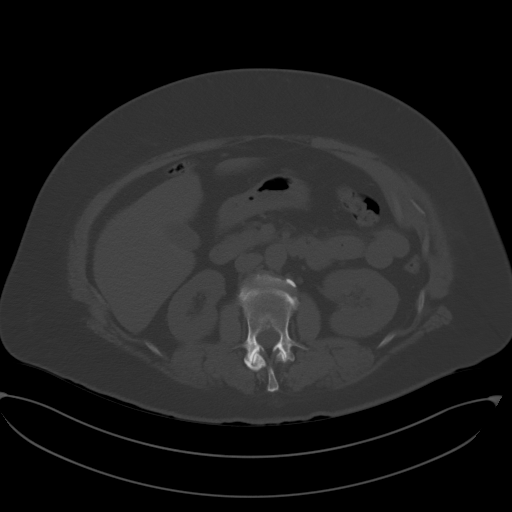
[im 63/89  soft-tissue]
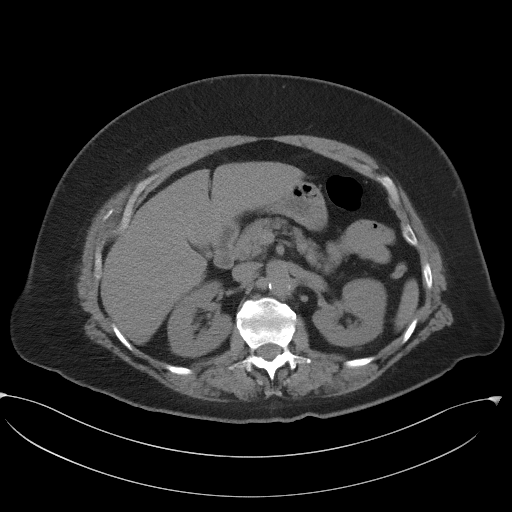
[im 70/89  soft-tissue]
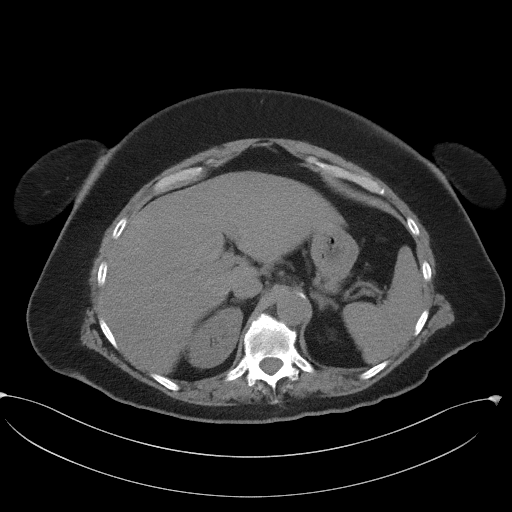
[im 78/89  soft-tissue]
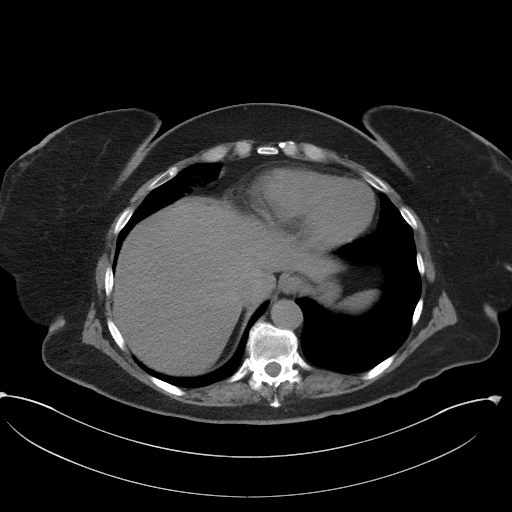
[im 85/89  soft-tissue]
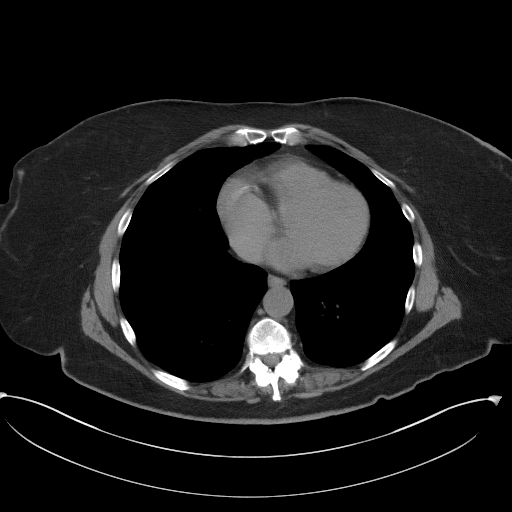

[Series 5: coronal st · coronal · 0.69mm/px · 3 of 96 slices shown]
[im 32/96  soft-tissue]
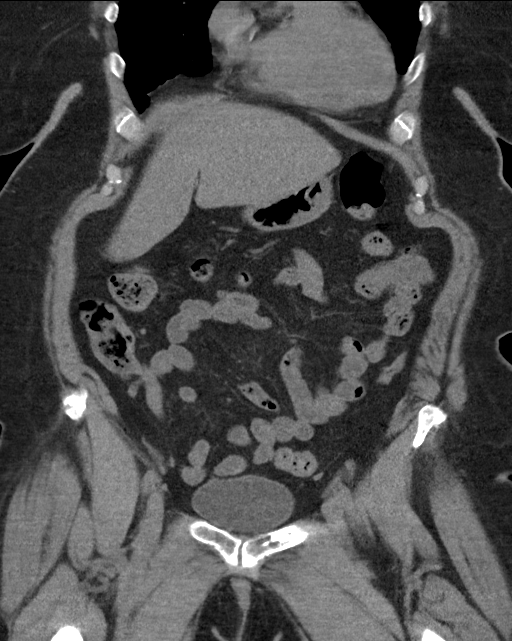
[im 43/96  soft-tissue]
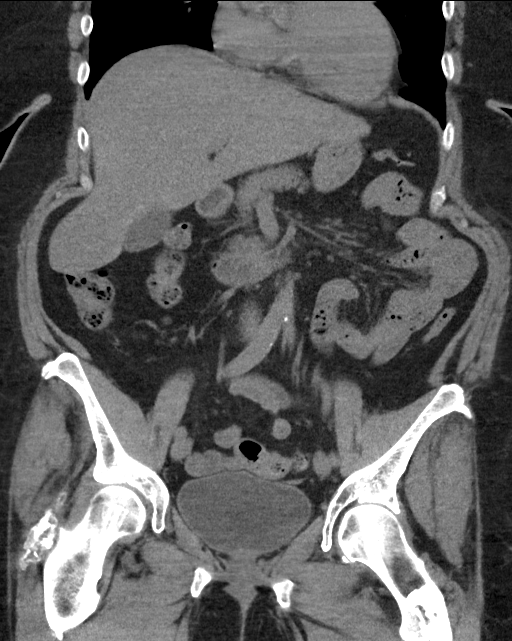
[im 53/96  soft-tissue]
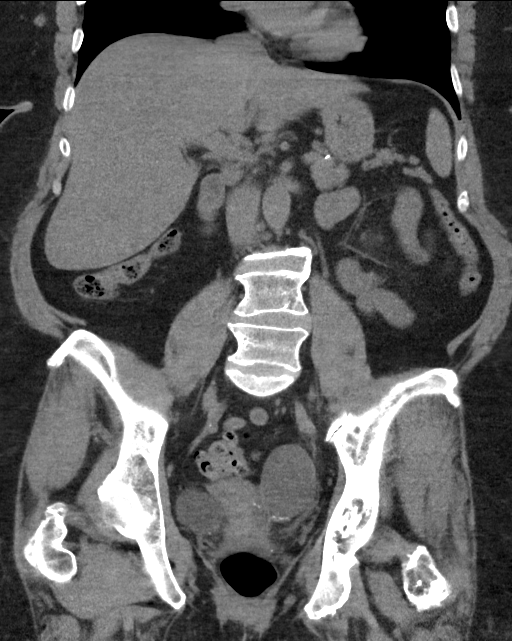

[16 of 46 positions shown; findings below may reference images not displayed]

FINDINGS: Lower chest: Lung bases clear bilaterally. Heart size within normal
limits.

Hepatobiliary: No focal liver abnormality is seen. No gallstones,
gallbladder wall thickening, or biliary dilatation.

Pancreas: Negative

Spleen: Negative

Adrenals/Urinary Tract: Adrenal glands are unremarkable. Kidneys are
normal, without renal calculi, focal lesion, or hydronephrosis.
Bladder is unremarkable.

Stomach/Bowel: Negative for bowel obstruction. Mild sigmoid
diverticulosis. Normal appendix. No bowel mass or edema.

Vascular/Lymphatic: Mild atherosclerotic aorta without aneurysm.
Negative for lymphadenopathy.

Reproductive: Left adnexal cyst 4 x 5 cm. Previously this measured
2.5 x 3.0 cm. This appears to be a simple cyst however given the
interval growth, cystic neoplasm is possible. Negative uterus. No
free fluid.

Other: None

Musculoskeletal: Sclerotic changes in the left hemipelvis ,
unchanged from the prior CT.
IMPRESSION: Negative for urinary tract calculi. No cause for acute abdominal
pain

4 x 5 cm left adnexal cyst with interval growth since 8128. Cystic
neoplasm of the ovary is possible. Recommend gynecologic evaluation.

Stable chronic sclerotic changes in the left hemipelvis. Cortical
thickening left iliac and ischial bone and left femur. Possible
melorheostosis versus Paget's disease.

## 2019-06-03 DIAGNOSIS — Z008 Encounter for other general examination: Secondary | ICD-10-CM | POA: Diagnosis not present

## 2019-06-03 DIAGNOSIS — I10 Essential (primary) hypertension: Secondary | ICD-10-CM | POA: Diagnosis not present

## 2019-06-03 DIAGNOSIS — E785 Hyperlipidemia, unspecified: Secondary | ICD-10-CM | POA: Diagnosis not present

## 2019-06-03 DIAGNOSIS — Z719 Counseling, unspecified: Secondary | ICD-10-CM | POA: Diagnosis not present

## 2019-06-18 DIAGNOSIS — Z23 Encounter for immunization: Secondary | ICD-10-CM | POA: Diagnosis not present

## 2019-07-01 DIAGNOSIS — E785 Hyperlipidemia, unspecified: Secondary | ICD-10-CM | POA: Diagnosis not present

## 2019-07-01 DIAGNOSIS — M179 Osteoarthritis of knee, unspecified: Secondary | ICD-10-CM | POA: Diagnosis not present

## 2019-07-01 DIAGNOSIS — K219 Gastro-esophageal reflux disease without esophagitis: Secondary | ICD-10-CM | POA: Diagnosis not present

## 2019-07-01 DIAGNOSIS — I10 Essential (primary) hypertension: Secondary | ICD-10-CM | POA: Diagnosis not present

## 2019-07-10 ENCOUNTER — Other Ambulatory Visit: Payer: Self-pay | Admitting: Family Medicine

## 2019-07-10 DIAGNOSIS — I10 Essential (primary) hypertension: Secondary | ICD-10-CM

## 2019-07-10 NOTE — Telephone Encounter (Signed)
Rakes. NTBS LOV 07/02/18

## 2019-07-16 DIAGNOSIS — Z23 Encounter for immunization: Secondary | ICD-10-CM | POA: Diagnosis not present

## 2019-07-31 ENCOUNTER — Other Ambulatory Visit: Payer: Self-pay | Admitting: *Deleted

## 2019-07-31 DIAGNOSIS — I10 Essential (primary) hypertension: Secondary | ICD-10-CM

## 2019-07-31 NOTE — Telephone Encounter (Signed)
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

## 2019-07-31 NOTE — Telephone Encounter (Signed)
Pt goes to [facility] in [facility] now. Rakes removed from PCP.

## 2019-08-20 ENCOUNTER — Other Ambulatory Visit: Payer: Self-pay

## 2019-08-20 DIAGNOSIS — I10 Essential (primary) hypertension: Secondary | ICD-10-CM

## 2019-08-20 MED ORDER — ATENOLOL 50 MG PO TABS: 50.0000 mg | ORAL_TABLET | Freq: Every day | ORAL | 0 refills | Status: AC

## 2019-09-13 ENCOUNTER — Other Ambulatory Visit: Payer: Self-pay | Admitting: Family Medicine

## 2019-09-13 DIAGNOSIS — I10 Essential (primary) hypertension: Secondary | ICD-10-CM

## 2019-10-02 DIAGNOSIS — Z7689 Persons encountering health services in other specified circumstances: Secondary | ICD-10-CM | POA: Diagnosis not present

## 2019-10-02 DIAGNOSIS — Z6841 Body Mass Index (BMI) 40.0 and over, adult: Secondary | ICD-10-CM | POA: Diagnosis not present

## 2019-10-07 ENCOUNTER — Other Ambulatory Visit: Payer: Self-pay | Admitting: Family Medicine

## 2019-10-07 DIAGNOSIS — I10 Essential (primary) hypertension: Secondary | ICD-10-CM

## 2019-10-08 NOTE — Telephone Encounter (Signed)
Former Rakes. NTBS 30 days given 08/20/19 °

## 2019-10-08 NOTE — Telephone Encounter (Signed)
Left message for patient to call to schedule appointment

## 2019-10-17 DIAGNOSIS — H2513 Age-related nuclear cataract, bilateral: Secondary | ICD-10-CM | POA: Diagnosis not present

## 2019-10-17 DIAGNOSIS — H40033 Anatomical narrow angle, bilateral: Secondary | ICD-10-CM | POA: Diagnosis not present

## 2019-11-18 DIAGNOSIS — Z1231 Encounter for screening mammogram for malignant neoplasm of breast: Secondary | ICD-10-CM | POA: Diagnosis not present

## 2019-11-20 DIAGNOSIS — R928 Other abnormal and inconclusive findings on diagnostic imaging of breast: Secondary | ICD-10-CM

## 2019-11-27 DIAGNOSIS — R928 Other abnormal and inconclusive findings on diagnostic imaging of breast: Secondary | ICD-10-CM | POA: Diagnosis not present
